# Patient Record
Sex: Female | Born: 1955 | Hispanic: No | Marital: Married | Smoking: Never smoker
Health system: Southern US, Community
[De-identification: ages and names within clinical notes are randomized; demographics above are authoritative.]

## PROBLEM LIST (undated history)

## (undated) ENCOUNTER — Emergency Department (HOSPITAL_COMMUNITY): Admission: EM | Discharge status: Absent without leave | Payer: 59

## (undated) DIAGNOSIS — B1920 Unspecified viral hepatitis C without hepatic coma: Secondary | ICD-10-CM

## (undated) DIAGNOSIS — S065X9A Traumatic subdural hemorrhage with loss of consciousness of unspecified duration, initial encounter: Secondary | ICD-10-CM

## (undated) DIAGNOSIS — K759 Inflammatory liver disease, unspecified: Secondary | ICD-10-CM

## (undated) DIAGNOSIS — I10 Essential (primary) hypertension: Secondary | ICD-10-CM

## (undated) DIAGNOSIS — E079 Disorder of thyroid, unspecified: Secondary | ICD-10-CM

## (undated) DIAGNOSIS — S065XAA Traumatic subdural hemorrhage with loss of consciousness status unknown, initial encounter: Secondary | ICD-10-CM

## (undated) HISTORY — DX: Inflammatory liver disease, unspecified: K75.9

## (undated) HISTORY — PX: CRANIOTOMY: SHX93

---

## 2013-09-19 ENCOUNTER — Emergency Department (HOSPITAL_COMMUNITY): Payer: Self-pay

## 2013-09-19 ENCOUNTER — Encounter (HOSPITAL_COMMUNITY): Payer: Self-pay | Admitting: Emergency Medicine

## 2013-09-19 ENCOUNTER — Emergency Department (HOSPITAL_COMMUNITY)
Admission: EM | Admit: 2013-09-19 | Discharge: 2013-09-19 | Disposition: A | Discharge status: Home / Self Care | Payer: Self-pay | Attending: Emergency Medicine | Admitting: Emergency Medicine

## 2013-09-19 DIAGNOSIS — R5381 Other malaise: Secondary | ICD-10-CM | POA: Insufficient documentation

## 2013-09-19 DIAGNOSIS — Z862 Personal history of diseases of the blood and blood-forming organs and certain disorders involving the immune mechanism: Secondary | ICD-10-CM | POA: Insufficient documentation

## 2013-09-19 DIAGNOSIS — Z8639 Personal history of other endocrine, nutritional and metabolic disease: Secondary | ICD-10-CM | POA: Insufficient documentation

## 2013-09-19 DIAGNOSIS — R5383 Other fatigue: Secondary | ICD-10-CM

## 2013-09-19 DIAGNOSIS — I10 Essential (primary) hypertension: Secondary | ICD-10-CM | POA: Insufficient documentation

## 2013-09-19 DIAGNOSIS — Z8619 Personal history of other infectious and parasitic diseases: Secondary | ICD-10-CM | POA: Insufficient documentation

## 2013-09-19 DIAGNOSIS — R079 Chest pain, unspecified: Secondary | ICD-10-CM | POA: Insufficient documentation

## 2013-09-19 HISTORY — DX: Unspecified viral hepatitis C without hepatic coma: B19.20

## 2013-09-19 HISTORY — DX: Essential (primary) hypertension: I10

## 2013-09-19 HISTORY — DX: Disorder of thyroid, unspecified: E07.9

## 2013-09-19 LAB — HEPATIC FUNCTION PANEL
ALT: 85 U/L — ABNORMAL HIGH (ref 0–35)
AST: 69 U/L — ABNORMAL HIGH (ref 0–37)
Albumin: 3.5 g/dL (ref 3.5–5.2)
Alkaline Phosphatase: 82 U/L (ref 39–117)
Bilirubin, Direct: <0.2 mg/dL (ref 0.0–0.3)
TOTAL PROTEIN: 7 g/dL (ref 6.0–8.3)
Total Bilirubin: 0.3 mg/dL (ref 0.3–1.2)

## 2013-09-19 LAB — BASIC METABOLIC PANEL
BUN: 13 mg/dL (ref 6–23)
CO2: 24 meq/L (ref 19–32)
Calcium: 9.7 mg/dL (ref 8.4–10.5)
Chloride: 104 mEq/L (ref 96–112)
Creatinine, Ser: 0.62 mg/dL (ref 0.50–1.10)
GFR calc Af Amer: >90 mL/min (ref >90)
GFR calc non Af Amer: >90 mL/min (ref >90)
Glucose, Bld: 106 mg/dL — ABNORMAL HIGH (ref 70–99)
POTASSIUM: 4 meq/L (ref 3.7–5.3)
SODIUM: 139 meq/L (ref 137–147)

## 2013-09-19 LAB — CBC
HCT: 43.3 % (ref 36.0–46.0)
Hemoglobin: 14.4 g/dL (ref 12.0–15.0)
MCH: 29.4 pg (ref 26.0–34.0)
MCHC: 33.3 g/dL (ref 30.0–36.0)
MCV: 88.4 fL (ref 78.0–100.0)
PLATELETS: 202 10*3/uL (ref 150–400)
RBC: 4.9 MIL/uL (ref 3.87–5.11)
RDW: 12.7 % (ref 11.5–15.5)
WBC: 8.2 10*3/uL (ref 4.0–10.5)

## 2013-09-19 LAB — I-STAT TROPONIN, ED
TROPONIN I, POC: 0 ng/mL (ref 0.00–0.08)
TROPONIN I, POC: 0 ng/mL (ref 0.00–0.08)

## 2013-09-19 LAB — AMMONIA: AMMONIA: 38 umol/L (ref 11–60)

## 2013-09-19 LAB — LIPASE, BLOOD: LIPASE: 47 U/L (ref 11–59)

## 2013-09-19 NOTE — ED Notes (Signed)
Pt with hx of hep c. Pt's visitor reports that the pt feels more full after eating, and has increased pain on the right side after eating. Pt reports increased gas and bloating.

## 2013-09-19 NOTE — ED Notes (Signed)
Per family sts pt yesterday was having some left arm pain and today began having chest pain, N,V. sts cold chills and hot flashes.

## 2013-09-19 NOTE — Discharge Instructions (Signed)
Fatigue °Fatigue is a feeling of tiredness, lack of energy, lack of motivation, or feeling tired all the time. Having enough rest, good nutrition, and reducing stress will normally reduce fatigue. Consult your caregiver if it persists. The nature of your fatigue will help your caregiver to find out its cause. The treatment is based on the cause.  °CAUSES  °There are many causes for fatigue. Most of the time, fatigue can be traced to one or more of your habits or routines. Most causes fit into one or more of three general areas. They are: °Lifestyle problems °· Sleep disturbances. °· Overwork. °· Physical exertion. °· Unhealthy habits. °· Poor eating habits or eating disorders. °· Alcohol and/or drug use . °· Lack of proper nutrition (malnutrition). °Psychological problems °· Stress and/or anxiety problems. °· Depression. °· Grief. °· Boredom. °Medical Problems or Conditions °· Anemia. °· Pregnancy. °· Thyroid gland problems. °· Recovery from major surgery. °· Continuous pain. °· Emphysema or asthma that is not well controlled °· Allergic conditions. °· Diabetes. °· Infections (such as mononucleosis). °· Obesity. °· Sleep disorders, such as sleep apnea. °· Heart failure or other heart-related problems. °· Cancer. °· Kidney disease. °· Liver disease. °· Effects of certain medicines such as antihistamines, cough and cold remedies, prescription pain medicines, heart and blood pressure medicines, drugs used for treatment of cancer, and some antidepressants. °SYMPTOMS  °The symptoms of fatigue include:  °· Lack of energy. °· Lack of drive (motivation). °· Drowsiness. °· Feeling of indifference to the surroundings. °DIAGNOSIS  °The details of how you feel help guide your caregiver in finding out what is causing the fatigue. You will be asked about your present and past health condition. It is important to review all medicines that you take, including prescription and non-prescription items. A thorough exam will be done.  You will be questioned about your feelings, habits, and normal lifestyle. Your caregiver may suggest blood tests, urine tests, or other tests to look for common medical causes of fatigue.  °TREATMENT  °Fatigue is treated by correcting the underlying cause. For example, if you have continuous pain or depression, treating these causes will improve how you feel. Similarly, adjusting the dose of certain medicines will help in reducing fatigue.  °HOME CARE INSTRUCTIONS  °· Try to get the required amount of good sleep every night. °· Eat a healthy and nutritious diet, and drink enough water throughout the day. °· Practice ways of relaxing (including yoga or meditation). °· Exercise regularly. °· Make plans to change situations that cause stress. Act on those plans so that stresses decrease over time. Keep your work and personal routine reasonable. °· Avoid street drugs and minimize use of alcohol. °· Start taking a daily multivitamin after consulting your caregiver. °SEEK MEDICAL CARE IF:  °· You have persistent tiredness, which cannot be accounted for. °· You have fever. °· You have unintentional weight loss. °· You have headaches. °· You have disturbed sleep throughout the night. °· You are feeling sad. °· You have constipation. °· You have dry skin. °· You have gained weight. °· You are taking any new or different medicines that you suspect are causing fatigue. °· You are unable to sleep at night. °· You develop any unusual swelling of your legs or other parts of your body. °SEEK IMMEDIATE MEDICAL CARE IF:  °· You are feeling confused. °· Your vision is blurred. °· You feel faint or pass out. °· You develop severe headache. °· You develop severe abdominal, pelvic, or   back pain. °· You develop chest pain, shortness of breath, or an irregular or fast heartbeat. °· You are unable to pass a normal amount of urine. °· You develop abnormal bleeding such as bleeding from the rectum or you vomit blood. °· You have thoughts  about harming yourself or committing suicide. °· You are worried that you might harm someone else. °MAKE SURE YOU:  °· Understand these instructions. °· Will watch your condition. °· Will get help right away if you are not doing well or get worse. °Document Released: 01/28/2007 Document Revised: 06/25/2011 Document Reviewed: 01/28/2007 °ExitCare® Patient Information ©2014 [employer]. ° ° ° °Emergency Department Resource Guide °1) Find a Doctor and Pay Out of Pocket °Although you won't have to find out who is covered by your insurance plan, it is a good idea to ask around and get recommendations. You will then need to call the office and see if the doctor you have chosen will accept you as a new patient and what types of options they offer for patients who are self-pay. Some doctors offer discounts or will set up payment plans for their patients who do not have insurance, but you will need to ask so you aren't surprised when you get to your appointment. ° °2) Contact Your Local Health Department °Not all health departments have doctors that can see patients for sick visits, but many do, so it is worth a call to see if yours does. If you don't know where your local health department is, you can check in your phone book. The CDC also has a tool to help you locate your state's health department, and many state websites also have listings of all of their local health departments. ° °3) Find a Walk-in Clinic °If your illness is not likely to be very severe or complicated, you may want to try a walk in clinic. These are popping up all over the country in pharmacies, drugstores, and shopping centers. They're usually staffed by nurse practitioners or physician assistants that have been trained to treat common illnesses and complaints. They're usually fairly quick and inexpensive. However, if you have serious medical issues or chronic medical problems, these are probably not your best option. ° °No Primary Care  Doctor: °- Call [facility] at  832-8000 - they can help you locate a primary care doctor that  accepts your insurance, provides certain services, etc. °- Physician Referral Service- 1-800-533-3463 ° °Chronic Pain Problems: °Organization         Address  Phone   Notes  °[facility]  (336) 297-2271 Patients need to be referred by their primary care doctor.  ° °Medication Assistance: °Organization         Address  Phone   Notes  °[place] [facility] [address] °[place], [place] [address] (336) 641-8030 --Must be a resident of [place] °-- Must have NO insurance coverage whatsoever (no Medicaid/ Medicare, etc.) °-- The pt. MUST have a primary care doctor that directs their care regularly and follows them in the community °  °[facility]  (866) 331-1348   °[employer]  (888) 892-1162   ° °Agencies that provide inexpensive medical care: °Organization         Address  Phone   Notes  °[facility]  (336) 832-8035   °[facility]    (336) 832-7272   °[facility] [address] °[place], [place] [address] (336) 832-4777   °  [facility] [address], °[place] (336) 271-4999   °[facility]    (336) 373-0678   °[facility]    (336) 272-1050   °[facility] ° [address], [place] Phone:  (336) 832-4444, Fax:  (336) 832-4440 Hours of Operation:  9 am - 6 pm, M-F.  Also accepts Medicaid/Medicare and self-pay.  °[facility] ° [address], [place] Phone: (336) 832-3150, Fax: (336) 832-3151. Hours of Operation:  8:30 am - 5:30 pm, M-F.  Also accepts Medicaid and self-pay.  °[facility] [place] [address], [place] Phone: (336) 878-6027   °[facility] [address], [place], [place] (336)723-1848, Ext. 123 Mondays & Thursdays: 7-9 AM.  First 15 patients are seen on a first  come, first serve basis. °  ° °Medicaid-accepting [place] Providers: ° °Organization         Address  Phone   Notes  °[facility] [address], [place] (336) 641-2100 Also accepts self-pay patients.  °[facility] [address], [place] ° (336) 856-9996   °[facility] [address], [place] (336) 288-8857   °[facility] [address] [place] [address], [place] (336) 299-7000   °Veita Bland [address], [place]  ° (336) 373-1557 Only accepts [place] Access Medicaid patients after they have their name applied to their card.  ° °Self-Pay (no insurance) in [place]: ° °Organization         Address  Phone   Notes  °Sickle Cell Patients, [facility] [address], [place] (336) 832-1970   °[facility] [address], [place] (336) 832-4400   °[facility] ° [address], [place] (336) 992-4800   °[facility]/Dr. Osei-Bonsu ° [address] [place] [address], [place] or [address], [place] (336) 841-8500 Phone number for both [place] and [place] locations is the same.  °[facility] [address], [place] (336) 299-0000   °[facility] [address] [place] [address], [place] or [address] (336) 852-7530 °(336) 878-2260   °[facility] [address], [place] (336) 350-1642, phone; (336) 294-5005, fax Sees patients 1st and 3rd Saturday of every month.  Must not qualify for public or private insurance (i.e. Medicaid, Medicare, [place] Health Choice, Veterans' Benefits) • Household income should be no more than 200% of the poverty level •The clinic cannot treat you if you are pregnant or think you are pregnant • Sexually transmitted diseases are not treated at the clinic.  ° ° °Dental Care: °Organization          Address  Phone  Notes  °[facility] [address], [place] (336) 641-6152 Accepts children up to age 21 who are enrolled in Medicaid or [place] Health Choice; pregnant women with a Medicaid card; and children who have applied for Medicaid or [place] Health Choice, but were declined, whose parents can pay a reduced fee at time of service.  °[facility] [place]  [address], [place] (336) 641-7733 Accepts children up to age 21 who are enrolled in Medicaid or [place] Health Choice; pregnant women with a Medicaid card; and   children who have applied for Medicaid or [place] Health Choice, but were declined, whose parents can pay a reduced fee at time of service.  °[facility] ° [address], [place] (336) 641-4533 Patients are seen by appointment only. Walk-ins are not accepted. [facility] will see patients 18 years of age and older. °Monday - Tuesday (8am-5pm) °Most Wednesdays (8:30-5pm) °$30 per visit, cash only  °[facility] ° [address], [place] (336) 641-4533 Patients are seen by appointment only. Walk-ins are not accepted. [facility] will see patients 18 years of age and older. °One Wednesday Evening (Monthly: Volunteer Based).  $30 per visit, cash only  °[facility]  (919) 537-3737 for adults; Children under age 4, call Graduate Pediatric Dentistry at (919) [phone]. Children aged 4-14, please call (919) [phone] to request a pediatric application. ° Dental services are provided in all areas of dental care including fillings, crowns and bridges, complete and partial dentures, implants, gum treatment, root canals, and extractions. Preventive care is also provided. Treatment is provided to both adults and children. °Patients are selected via a lottery and there is often a waiting list. °  °[facility] [address], °[place] ° (336) 763-8833 [email or web address] °  °[facility] [address], [place], [place] (336)723-1848, Ext. 123 Second and Fourth Thursday of each month, opens at 6:30 AM; Clinic ends at 9 AM.  Patients are seen on a first-come first-served basis, and a limited number are seen during each clinic.  ° °[facility] ° [address], [place], [place] (336) 723-7904   Eligibility Requirements °You must have lived in [place], [place], or [place] counties for at least the last three months. °  You cannot be eligible for state or federal sponsored healthcare insurance, including Veterans Administration, Medicaid, or Medicare. °  You generally cannot be eligible for healthcare insurance through your employer.  °  How to apply: °Eligibility screenings are held every Tuesday and Wednesday afternoon from 1:00 pm until 4:00 pm. You do not need an appointment for the interview!  °[facility] [address], [place], [place] 336-631-2330   °[facility]  336-342-8273   °[facility]  336-703-3100   °[facility]  336-570-6415   ° °Behavioral Health Resources in the Community: °Intensive Outpatient Programs °Organization         Address  Phone  Notes  °[facility] [address], [place], [place] 336-878-6098   °[facility] [address], [place], [place] 336-832-9800   °[facility] [address], [place], [place] ° 336-882-2125   °[facility] [address],  °[place], [place] 1-800-853-5163 or 336-641-4981   °Substance Abuse Resources °Organization         Address  Phone  Notes  °[facility]  336-882-2125   °[facility]  336-784-9470   °[facility]  336-285-9073   °[facility]  336-845-3988   °Residential & Outpatient Substance Abuse Program  1-800-659-3381   °Psychological  Services °Organization         Address  Phone  Notes  °[facility]  336- 832-9600   °[facility]  336- 378-7881   °[facility] [address], [place] 1-800-853-5163 or 336-641-4981   ° °Mobile Crisis Teams °Organization           Address  Phone  Notes  °[facility], Mobile Crisis Care Unit  1-877-626-1772   °Assertive °[facility] ° [address] [place], [place] 336-834-9664   °Sharon DeEsch [address] °[place] 336-554-5454   ° °Self-Help/Support Groups °Organization         Address  Phone             Notes  °[facility] - variety of support groups  336- 373-1402 Call for more information  °Narcotics Anonymous (NA), [facility] [address], °[place]  2 meetings at this location  ° °Residential Treatment Programs °Organization         Address  Phone  Notes  °[facility] [address],    °[place]  1-866-801-8205   °[facility] ° [address], [place], [place] 704-293-8524   °[facility] [address], [place] 336-845-3988 Admissions: 8am-3pm M-F  °[facility] [address]    °[place], [place] 336-841-1104   °[facility] [address], [place], [place] 336-379-7146   °[facility] [address]  °[place], [place] 336-285-9073   °[facility] Intensive Outpatient [address], [place], [place] 336-852-3033   °[facility] [address]  °[place], [place] 1-877-615-2722 or 336-784-9470   °[facility] [address] [place], [place] 336-227-7417 Accepts Medicaid  °[facility] [address]  ° [place] 1-800-659-3381 Substance Abuse/Addiction Treatment  ° °[place] Behavioral Health Resources °Organization         Address  Phone  Notes  °[facility]  (888)  581-9988   °Julie Brannon, PhD [address] [place], [place]   (336) 349-5553 or (336) 951-0000   °[facility]   [address] °[place], [place] (336) 349-4454   °[facility] [address], [place], [place] (336) 342-8316 Insurance/Medicaid/sponsorship through [facility]  °[facility] [address]                                    [place], [place] (336) 342-8316 Therapy/tele-psych/case  °[facility] [address]  ° [place], [place] (336) 349-2233    °Dr. Arfeen  (336) 349-4544   °[facility] [place]  [employer] [place] [facility] 1) [address], [place] °2) [address], [place] °3)  [address], [place] (336) 349-3220 °(336) 342-7768 ° °(336) 342-8140   °[place] [facility] (336) 342-1394 or (336) 342-3537 (After Hours)    ° °  °

## 2013-09-19 NOTE — ED Provider Notes (Signed)
CSN: [id number]     Arrival date & time 09/19/13  1425 History   First MD Initiated Contact with Patient 09/19/13 1928     Chief Complaint  Patient presents with  . Chest Pain  . Arm Pain   Family translator HPI Pt has been having trouble with general fatigue for a long period of time.  She was told in the past she had hepatitis C.  [facility] but has not been able to get in touch with them.  Pt has been having pain in her whole body for a long time but yesterday she had pain in her left arm.  Today then she had pain in her left chest and still has pain in her left arm.  She has been nauseated but no vomiting.  No shortness of breath.  No abdominal pain but she does feel feel bloated with eating. Past Medical History  Diagnosis Date  . Hypertension   . Thyroid disease   . Hepatitis C    History reviewed. No pertinent past surgical history. History reviewed. No pertinent family history. History  Substance Use Topics  . Smoking status: Never Smoker   . Smokeless tobacco: Not on file  . Alcohol Use: No   OB History   Grav Para Term Preterm Abortions TAB SAB Ect Mult Living                 Review of Systems  All other systems reviewed and are negative.     Allergies  Review of patient's allergies indicates no known allergies.  Home Medications   Prior to Admission medications   Not on File   BP 146/73  Pulse 71  Temp(Src) 98.7 F (37.1 C) (Oral)  Resp 23  Wt 152 lb 7 oz (69.145 kg)  SpO2 98% Physical Exam  Nursing note and vitals reviewed. Constitutional: She appears well-developed and well-nourished. No distress.  HENT:  Head: Normocephalic and atraumatic.  Right Ear: External ear normal.  Left Ear: External ear normal.  Eyes: Conjunctivae are normal. Right eye exhibits no discharge. Left eye exhibits no discharge. No scleral icterus.  Neck: Neck supple. No tracheal deviation present.  Cardiovascular: Normal rate, regular rhythm and intact  distal pulses.   Pulmonary/Chest: Effort normal and breath sounds normal. No stridor. No respiratory distress. She has no wheezes. She has no rales.  Abdominal: Soft. Bowel sounds are normal. She exhibits no distension. There is no tenderness. There is no rebound and no guarding.  Musculoskeletal: She exhibits no edema and no tenderness.  Neurological: She is alert. She has normal strength. No cranial nerve deficit (no facial droop, extraocular movements intact, no slurred speech) or sensory deficit. She exhibits normal muscle tone. She displays no seizure activity. Coordination normal.  Skin: Skin is warm and dry. No rash noted.  Psychiatric: She has a normal mood and affect.    ED Course  Procedures (including critical care time) Labs Review Labs Reviewed  BASIC METABOLIC PANEL - Abnormal; Notable for the following:    Glucose, Bld 106 (*)    All other components within normal limits  HEPATIC FUNCTION PANEL - Abnormal; Notable for the following:    AST 69 (*)    ALT 85 (*)    All other components within normal limits  CBC  LIPASE, BLOOD  AMMONIA  I-STAT TROPOININ, ED  [place] SensorI-STAT TROPOININ, ED    Imaging Review Dg Chest 2 View  09/19/2013   CLINICAL DATA:  Left-sided chest  pain. Cough. Hypertension. Chronic hepatitis-C.  EXAM: CHEST  2 VIEW  COMPARISON:  None.  FINDINGS: The heart size and mediastinal contours are within normal limits. Both lungs are clear. Tiny calcified granuloma noted in the right upper lobe. The visualized skeletal structures are unremarkable.  IMPRESSION: No active cardiopulmonary disease.   Electronically Signed   By: Myles Rosenthal M.D.   On: 09/19/2013 16:08     EKG Interpretation   Date/Time:  Saturday September 19 2013 14:34:00 EDT Ventricular Rate:  73 PR Interval:  164 QRS Duration: 64 QT Interval:  376 QTC Calculation: 414 R Axis:   -9 Text Interpretation:  Normal sinus rhythm Low voltage QRS No old tracing  to compare Confirmed by Joyous Gleghorn  MD-J, Otila Starn ([id number])  on 09/19/2013 7:45:54 PM      MDM   Final diagnoses:  Fatigue  Chest pain    The patient has had symptoms like this for years. The patient recently moved to this country and does not have any primary medical care. She was monitored for several hours in the emergency department. 2 sets of cardiac enzymes were normal. Patient is not anemic. Electrolytes unremarkable. EKG and chest x-ray are showing.  Discussed the findings with the patient and family. At this point there does not appear to be any evidence of an acute emergency medical condition. I think she would benefit from a primary care physician and a full physical to further investigate her issues with chronic fatigue. Family is also concerned about monitoring of her hepatitis which primary care doctor could also manage.    Linwood Dibbles, MD 09/19/13 2158

## 2013-11-03 ENCOUNTER — Emergency Department (HOSPITAL_COMMUNITY): Payer: Self-pay

## 2013-11-03 ENCOUNTER — Encounter (HOSPITAL_COMMUNITY): Payer: Self-pay | Admitting: Emergency Medicine

## 2013-11-03 ENCOUNTER — Emergency Department (HOSPITAL_COMMUNITY)
Admission: EM | Admit: 2013-11-03 | Discharge: 2013-11-03 | Disposition: A | Discharge status: Home / Self Care | Payer: Self-pay | Attending: Emergency Medicine | Admitting: Emergency Medicine

## 2013-11-03 DIAGNOSIS — J3489 Other specified disorders of nose and nasal sinuses: Secondary | ICD-10-CM | POA: Insufficient documentation

## 2013-11-03 DIAGNOSIS — R259 Unspecified abnormal involuntary movements: Secondary | ICD-10-CM | POA: Insufficient documentation

## 2013-11-03 DIAGNOSIS — R51 Headache: Secondary | ICD-10-CM | POA: Insufficient documentation

## 2013-11-03 DIAGNOSIS — M542 Cervicalgia: Secondary | ICD-10-CM | POA: Insufficient documentation

## 2013-11-03 DIAGNOSIS — H9319 Tinnitus, unspecified ear: Secondary | ICD-10-CM | POA: Insufficient documentation

## 2013-11-03 DIAGNOSIS — R42 Dizziness and giddiness: Secondary | ICD-10-CM | POA: Insufficient documentation

## 2013-11-03 DIAGNOSIS — I10 Essential (primary) hypertension: Secondary | ICD-10-CM | POA: Insufficient documentation

## 2013-11-03 DIAGNOSIS — M2550 Pain in unspecified joint: Secondary | ICD-10-CM | POA: Insufficient documentation

## 2013-11-03 DIAGNOSIS — H9190 Unspecified hearing loss, unspecified ear: Secondary | ICD-10-CM | POA: Insufficient documentation

## 2013-11-03 DIAGNOSIS — R519 Headache, unspecified: Secondary | ICD-10-CM

## 2013-11-03 DIAGNOSIS — R5383 Other fatigue: Secondary | ICD-10-CM

## 2013-11-03 DIAGNOSIS — Z8619 Personal history of other infectious and parasitic diseases: Secondary | ICD-10-CM | POA: Insufficient documentation

## 2013-11-03 DIAGNOSIS — R55 Syncope and collapse: Secondary | ICD-10-CM | POA: Insufficient documentation

## 2013-11-03 DIAGNOSIS — R64 Cachexia: Secondary | ICD-10-CM | POA: Insufficient documentation

## 2013-11-03 DIAGNOSIS — E079 Disorder of thyroid, unspecified: Secondary | ICD-10-CM | POA: Insufficient documentation

## 2013-11-03 DIAGNOSIS — R5381 Other malaise: Secondary | ICD-10-CM | POA: Insufficient documentation

## 2013-11-03 DIAGNOSIS — M549 Dorsalgia, unspecified: Secondary | ICD-10-CM | POA: Insufficient documentation

## 2013-11-03 DIAGNOSIS — H5710 Ocular pain, unspecified eye: Secondary | ICD-10-CM | POA: Insufficient documentation

## 2013-11-03 LAB — COMPREHENSIVE METABOLIC PANEL
ALT: 126 U/L — AB (ref 0–35)
AST: 107 U/L — AB (ref 0–37)
Albumin: 3.3 g/dL — ABNORMAL LOW (ref 3.5–5.2)
Alkaline Phosphatase: 111 U/L (ref 39–117)
Anion gap: 10 (ref 5–15)
BUN: 11 mg/dL (ref 6–23)
CALCIUM: 9.2 mg/dL (ref 8.4–10.5)
CO2: 25 mEq/L (ref 19–32)
Chloride: 107 mEq/L (ref 96–112)
Creatinine, Ser: 0.58 mg/dL (ref 0.50–1.10)
GFR calc Af Amer: >90 mL/min (ref >90)
GFR calc non Af Amer: >90 mL/min (ref >90)
GLUCOSE: 90 mg/dL (ref 70–99)
POTASSIUM: 4.1 meq/L (ref 3.7–5.3)
SODIUM: 142 meq/L (ref 137–147)
TOTAL PROTEIN: 6.9 g/dL (ref 6.0–8.3)
Total Bilirubin: 0.3 mg/dL (ref 0.3–1.2)

## 2013-11-03 LAB — I-STAT TROPONIN, ED: Troponin i, poc: 0.01 ng/mL (ref 0.00–0.08)

## 2013-11-03 LAB — CBC WITH DIFFERENTIAL/PLATELET
Basophils Absolute: 0 10*3/uL (ref 0.0–0.1)
Basophils Relative: 0 % (ref 0–1)
EOS ABS: 0.3 10*3/uL (ref 0.0–0.7)
Eosinophils Relative: 4 % (ref 0–5)
HCT: 43.9 % (ref 36.0–46.0)
Hemoglobin: 14.7 g/dL (ref 12.0–15.0)
LYMPHS ABS: 3.6 10*3/uL (ref 0.7–4.0)
LYMPHS PCT: 49 % — AB (ref 12–46)
MCH: 29.5 pg (ref 26.0–34.0)
MCHC: 33.5 g/dL (ref 30.0–36.0)
MCV: 88.2 fL (ref 78.0–100.0)
Monocytes Absolute: 0.5 10*3/uL (ref 0.1–1.0)
Monocytes Relative: 6 % (ref 3–12)
NEUTROS PCT: 41 % — AB (ref 43–77)
Neutro Abs: 3 10*3/uL (ref 1.7–7.7)
Platelets: 205 10*3/uL (ref 150–400)
RBC: 4.98 MIL/uL (ref 3.87–5.11)
RDW: 12.4 % (ref 11.5–15.5)
WBC: 7.4 10*3/uL (ref 4.0–10.5)

## 2013-11-03 LAB — URINALYSIS, ROUTINE W REFLEX MICROSCOPIC
Bilirubin Urine: NEGATIVE
GLUCOSE, UA: NEGATIVE mg/dL
Hgb urine dipstick: NEGATIVE
Ketones, ur: NEGATIVE mg/dL
NITRITE: NEGATIVE
PH: 6.5 (ref 5.0–8.0)
Protein, ur: NEGATIVE mg/dL
SPECIFIC GRAVITY, URINE: 1.01 (ref 1.005–1.030)
Urobilinogen, UA: 0.2 mg/dL (ref 0.0–1.0)

## 2013-11-03 LAB — URINE MICROSCOPIC-ADD ON

## 2013-11-03 MED ORDER — SODIUM CHLORIDE 0.9 % IV BOLUS (SEPSIS)
1000.0000 mL | Freq: Once | INTRAVENOUS | Status: AC
  Administered 2013-11-03: 1000 mL via INTRAVENOUS

## 2013-11-03 MED ORDER — MECLIZINE HCL 25 MG PO TABS
25.0000 mg | ORAL_TABLET | Freq: Once | ORAL | Status: AC
  Administered 2013-11-03: 25 mg via ORAL
  Filled 2013-11-03: qty 1

## 2013-11-03 MED ORDER — ACETAMINOPHEN 325 MG PO TABS
650.0000 mg | ORAL_TABLET | Freq: Once | ORAL | Status: AC
  Administered 2013-11-03: 650 mg via ORAL
  Filled 2013-11-03: qty 2

## 2013-11-03 MED ORDER — PROCHLORPERAZINE EDISYLATE 5 MG/ML IJ SOLN
10.0000 mg | Freq: Four times a day (QID) | INTRAMUSCULAR | Status: DC | PRN
  Administered 2013-11-03: 10 mg via INTRAVENOUS
  Filled 2013-11-03: qty 2

## 2013-11-03 MED ORDER — DIPHENHYDRAMINE HCL 50 MG/ML IJ SOLN
25.0000 mg | Freq: Once | INTRAMUSCULAR | Status: AC
  Administered 2013-11-03: 25 mg via INTRAVENOUS
  Filled 2013-11-03: qty 1

## 2013-11-03 MED ORDER — DIAZEPAM 5 MG PO TABS
5.0000 mg | ORAL_TABLET | Freq: Once | ORAL | Status: AC
  Administered 2013-11-03: 5 mg via ORAL
  Filled 2013-11-03: qty 1

## 2013-11-03 NOTE — Discharge Instructions (Signed)
Please followup with a primary care provider for continued evaluation and treatment.   Migraine Headache A migraine headache is an intense, throbbing pain on one or both sides of your head. A migraine can last for 30 minutes to several hours. CAUSES  The exact cause of a migraine headache is not always known. However, a migraine may be caused when nerves in the brain become irritated and release chemicals that cause inflammation. This causes pain. Certain things may also trigger migraines, such as:  Alcohol.  Smoking.  Stress.  Menstruation.  Aged cheeses.  Foods or drinks that contain nitrates, glutamate, aspartame, or tyramine.  Lack of sleep.  Chocolate.  Caffeine.  Hunger.  Physical exertion.  Fatigue.  Medicines used to treat chest pain (nitroglycerine), birth control pills, estrogen, and some blood pressure medicines. SIGNS AND SYMPTOMS  Pain on one or both sides of your head.  Pulsating or throbbing pain.  Severe pain that prevents daily activities.  Pain that is aggravated by any physical activity.  Nausea, vomiting, or both.  Dizziness.  Pain with exposure to bright lights, loud noises, or activity.  General sensitivity to bright lights, loud noises, or smells. Before you get a migraine, you may get warning signs that a migraine is coming (aura). An aura may include:  Seeing flashing lights.  Seeing bright spots, halos, or zig-zag lines.  Having tunnel vision or blurred vision.  Having feelings of numbness or tingling.  Having trouble talking.  Having muscle weakness. DIAGNOSIS  A migraine headache is often diagnosed based on:  Symptoms.  Physical exam.  A CT scan or MRI of your head. These imaging tests cannot diagnose migraines, but they can help rule out other causes of headaches. TREATMENT Medicines may be given for pain and nausea. Medicines can also be given to help prevent recurrent migraines.  HOME CARE INSTRUCTIONS  Only  take over-the-counter or prescription medicines for pain or discomfort as directed by your health care provider. The use of long-term narcotics is not recommended.  Lie down in a dark, quiet room when you have a migraine.  Keep a journal to find out what may trigger your migraine headaches. For example, write down:  What you eat and drink.  How much sleep you get.  Any change to your diet or medicines.  Limit alcohol consumption.  Quit smoking if you smoke.  Get 7-9 hours of sleep, or as recommended by your health care provider.  Limit stress.  Keep lights dim if bright lights bother you and make your migraines worse. SEEK IMMEDIATE MEDICAL CARE IF:   Your migraine becomes severe.  You have a fever.  You have a stiff neck.  You have vision loss.  You have muscular weakness or loss of muscle control.  You start losing your balance or have trouble walking.  You feel faint or pass out.  You have severe symptoms that are different from your first symptoms. MAKE SURE YOU:   Understand these instructions.  Will watch your condition.  Will get help right away if you are not doing well or get worse. Document Released: 04/02/2005 Document Revised: 01/21/2013 Document Reviewed: 12/08/2012 [facility] Patient Information 2015 [employer] [place]. This information is not intended to replace advice given to you by your health care provider. Make sure you discuss any questions you have with your health care provider.

## 2013-11-03 NOTE — ED Provider Notes (Signed)
CSN: [id number]     Arrival date & time 11/03/13  1644 History   First MD Initiated Contact with Patient 11/03/13 1735     Chief Complaint  Patient presents with  . Headache     (Consider location/radiation/quality/duration/timing/severity/associated sxs/prior Treatment) HPI  Patient is 58 yo Female with hx of Hep C, hypothyroidism, and HTN here with headache for 3-4 days, dizziness, and syncope today around 3 pm. She has tightness on her neck and tinnitus. Also has some hearing loss. She feels dizzy with some vertigo. She tried tylenol and Excedrin without relief for the HA. She feels pressure on top of her eyes and feels tight on her head. Her eyes feel tired. She recently moved from [place]. At [place], she was having coma like episodes per family member which was thought to be due to Hep C. Denies any cardiac problem in the past. Has some questionable history of seizure.   She has some tremors, even during our conversation. She feels upper abdominal pressure/pain with movement or exertion. Currently she doesn't feel this. Has spasm/tightness of her left leg. Has gum bleeding in the morning when she tries to brush her teeth.  Denies diarrhea/constipation, has nausea, denies emesis. Has lower back pain. Feels time tingling on her arms. Denies any focal weakness. Feels generalized weakness.    Past Medical History  Diagnosis Date  . Hypertension   . Thyroid disease   . Hepatitis C    History reviewed. No pertinent past surgical history. History reviewed. No pertinent family history. History  Substance Use Topics  . Smoking status: Never Smoker   . Smokeless tobacco: Not on file  . Alcohol Use: No   OB History   Grav Para Term Preterm Abortions TAB SAB Ect Mult Living                 Review of Systems  Constitutional: Positive for chills and fatigue. Negative for fever.  HENT: Positive for congestion, hearing loss, tinnitus and trouble swallowing. Negative for ear discharge,  ear pain, facial swelling, nosebleeds, postnasal drip, sinus pressure, sneezing and sore throat.   Eyes: Positive for pain. Negative for photophobia and redness.  Endocrine: Negative.   Genitourinary: Negative.   Musculoskeletal: Positive for arthralgias, back pain, gait problem, neck pain and neck stiffness. Negative for joint swelling and myalgias.  Skin: Negative.   Neurological: Positive for dizziness, tremors, syncope, light-headedness and headaches. Negative for seizures, facial asymmetry, speech difficulty, weakness and numbness.  Hematological: Negative.   Psychiatric/Behavioral: Negative.     Allergies  Review of patient's allergies indicates no known allergies.  Home Medications   Prior to Admission medications   Medication Sig Start Date End Date Taking? Authorizing Provider  acetaminophen (TYLENOL) 325 MG tablet Take 650 mg by mouth every 6 (six) hours as needed for headache.   Yes Historical Provider, MD  aspirin-acetaminophen-caffeine (EXCEDRIN MIGRAINE) [id number] MG per tablet Take 1 tablet by mouth every 6 (six) hours as needed for headache.   Yes Historical Provider, MD  bisoprolol-hydrochlorothiazide (ZIAC) 2.5-6.25 MG per tablet Take 1 tablet by mouth daily.   Yes Historical Provider, MD  ibuprofen (ADVIL,MOTRIN) 200 MG tablet Take 200 mg by mouth every 6 (six) hours as needed for headache.   Yes Historical Provider, MD  levothyroxine (SYNTHROID, LEVOTHROID) 75 MCG tablet Take 75 mcg by mouth daily before breakfast.   Yes Historical Provider, MD   BP 146/84  Pulse 56  Temp(Src) 98.9 F (37.2 C) (Oral)  Resp 23  SpO2 100% Physical Exam  Constitutional: She is oriented to person, place, and time. Vital signs are normal. She appears cachectic. She has a sickly appearance.  HENT:  Head: Normocephalic and atraumatic.  Right Ear: External ear normal.  Left Ear: External ear normal.  Nose: Right sinus exhibits frontal sinus tenderness. Right sinus exhibits no  maxillary sinus tenderness. Left sinus exhibits frontal sinus tenderness. Left sinus exhibits no maxillary sinus tenderness.  Mouth/Throat: Uvula is midline. Mucous membranes are dry.  Eyes: Conjunctivae and EOM are normal. Pupils are equal, round, and reactive to light. Left conjunctiva has no hemorrhage. No scleral icterus.  Neck: Trachea normal, normal range of motion and full passive range of motion without pain. Neck supple. No JVD present. Muscular tenderness present. No spinous process tenderness present. No rigidity. Normal range of motion present. No Brudzinski's sign and no Kernig's sign noted. No mass present.  Cardiovascular: Normal rate and regular rhythm.  Exam reveals no gallop.   No murmur heard. Pulmonary/Chest: Effort normal. No respiratory distress. She exhibits no mass, no tenderness, no laceration and no edema.  Abdominal: Soft. Normal appearance and bowel sounds are normal. There is no tenderness. There is no rebound and no CVA tenderness.  Musculoskeletal: Normal range of motion. She exhibits no edema.  Right hand thenar region mildly tender to palpation from falling during her syncope. ROM normal.   Neurological: She is alert and oriented to person, place, and time. She has normal strength. She displays tremor. No cranial nerve deficit or sensory deficit. She displays a negative Romberg sign. Coordination and gait normal.  Normal nose-to-finger test. Normal repetitive task. Normal heel to shin test. No pronator drift.   Skin: Skin is warm.    ED Course  Procedures (including critical care time) Labs Review Labs Reviewed  CBC WITH DIFFERENTIAL  COMPREHENSIVE METABOLIC PANEL  URINALYSIS, ROUTINE W REFLEX MICROSCOPIC  I-STAT TROPOININ, ED    Imaging Review Ct Head Wo Contrast  11/03/2013   CLINICAL DATA:  Headache  EXAM: CT HEAD WITHOUT CONTRAST  TECHNIQUE: Contiguous axial images were obtained from the base of the skull through the vertex without intravenous contrast.   COMPARISON:  None.  FINDINGS: No skull fracture is noted. Paranasal sinuses mastoid air cells are unremarkable.  No intracranial hemorrhage, mass effect or midline shift.  No hydrocephalus. No acute cortical infarction. No mass lesion is noted on this unenhanced scan.  The gray and white-matter differentiation is preserved.  IMPRESSION: No acute intracranial abnormality.   Electronically Signed   By: Natasha Mead M.D.   On: 11/03/2013 20:30   Dg Hand Complete Right  11/03/2013   CLINICAL DATA:  Larey Seat today with pain in the fifth metacarpal radiating into distal forearm  EXAM: RIGHT HAND - COMPLETE 3+ VIEW  COMPARISON:  None.  FINDINGS: There is no evidence of fracture or dislocation. There is no evidence of arthropathy or other focal bone abnormality. Soft tissues are unremarkable.  IMPRESSION: Negative.   Electronically Signed   By: Esperanza Heir M.D.   On: 11/03/2013 20:33     EKG Interpretation   Date/Time:  Tuesday November 03 2013 18:47:46 EDT Ventricular Rate:  55 PR Interval:  170 QRS Duration: 76 QT Interval:  439 QTC Calculation: 420 R Axis:   -9 Text Interpretation:  Sinus rhythm Anterior infarct, old Confirmed by  HORTON  MD, COURTNEY ([id number]) on 11/03/2013 7:15:21 PM      UA, CBC, CMP, EKG, troponin, CT head. Valium 5mg  PO + meclizine 25  mg PO.   Patient continues to feel dizzy. Able to walk in the room by herself with normal gait.  Hand xray normal. CT head normal.  HA is not improving. Dizziness resolved with meclizine.   Bolus 1 L, compazine and benadryl.  MDM   Final diagnoses:  Acute intractable headache, unspecified headache type  Syncope, unspecified syncope type   Patient here with persistent headache, syncope, dizziness, tremors, and tinnitus. Normal neuro exam. CT head was normal. Is borderline orthostatic (pulse increases by 15 upon standing).  Should be able to go home if headache gets better with fludis and compazine+benadryl.   Should follow up with a  primary care doctor about her symptoms.     Hyacinth Meeker, MD 11/03/13 2114

## 2013-11-03 NOTE — ED Notes (Signed)
Pt alert x4 respirations easy non labored.  

## 2013-11-03 NOTE — ED Notes (Signed)
Patient transported to CT 

## 2013-11-03 NOTE — ED Notes (Signed)
Pt placed into gown and on monitor upon arrival to room. Pt monitored by 5 lead, blood pressure, and pulse ox.  

## 2013-11-03 NOTE — ED Notes (Signed)
Pt placed back on monitor upon return to room from CT. Pt monitored by 12 lead, blood pressure, and pulse ox.

## 2013-11-03 NOTE — ED Notes (Signed)
Pt and family member reports pt having severe headache for several days. No relief with tylenol or excedrin. Reports dizziness and feeling off balance for several days and having nausea, no vomiting.

## 2013-11-03 NOTE — ED Notes (Signed)
Pt ambulates with one assist to bathroom

## 2013-11-03 NOTE — ED Notes (Signed)
Pt continues to be monitored by blood pressure, 5 lead, blood pressure.

## 2013-11-03 NOTE — ED Provider Notes (Signed)
Alyssa Lyons S 8:30 PM patient discussed in sign out. Patient having a headache with history of the same. There was a delay in obtaining blood for testing. Head CT unremarkable. Normal nonfocal neuro exam. Patient receiving migraine cocktail. Will evaluate laboratory testing and recheck condition.  10:00 PM laboratory testing unremarkable. Negative head CT. Patient reports no headache after medication she is feeling well continues to have normal nonfocal neuro exam. May be discharged at this time.  Angus Sellereter S Debera [name], PA-C 11/03/13 2210

## 2013-11-04 NOTE — ED Provider Notes (Signed)
Medical screening examination/treatment/procedure(s) were performed by non-physician practitioner and as supervising physician I was immediately available for consultation/collaboration.   EKG Interpretation   Date/Time:  Tuesday November 03 2013 18:47:46 EDT Ventricular Rate:  55 PR Interval:  170 QRS Duration: 76 QT Interval:  439 QTC Calculation: 420 R Axis:   -9 Text Interpretation:  Sinus rhythm Anterior infarct, old Confirmed by  HORTON  MD, COURTNEY ([id number]) on 11/03/2013 7:15:21 PM       Derwood KaplanAnkit Delaine Hernandez, MD 11/04/13 [id number]

## 2013-11-05 NOTE — ED Provider Notes (Signed)
I saw and evaluated the patient, reviewed the resident's note and I agree with the findings and plan.   EKG Interpretation   Date/Time:  Tuesday November 03 2013 18:47:46 EDT Ventricular Rate:  55 PR Interval:  170 QRS Duration: 76 QT Interval:  439 QTC Calculation: 420 R Axis:   -9 Text Interpretation:  Sinus rhythm Anterior infarct, old Confirmed by  Stephon Weathers  MD, Lamyiah Crawshaw ([id number]) on 11/03/2013 7:15:21 PM      This is a 58 year old female with a history of hypertension who presents with headache, vertigo, and one episode of syncope. Patient reports headache for 2 days. It is achy in nature and gradual in onset. Currently pain is 6/10. She endorses ringing in her ear as well as hearing loss. She describes her dizziness as room spinning. She had an episode of room spinning dizziness and reports that she passed out. She did not hit her head. No prior episodes like this. She denies any chest pain or shortness of breath during these episodes.  Patient is nontoxic on exam.  Vital signs are stable.  No neurologic deficits noted on exam with no noted difficulties with finger-nose-finger. Patient also able to ambulate without difficulty. Heart rate does increase 15 when patient stands up. Patient was given fluids, meclizine, and Valium. She reports improvement of her vertigo symptoms. She's also given a headache cocktail.  EKG shows no evidence of arrhythmia and CT head is negative. Lab work is pending at time of signout.  Have low suspicion at this time for stroke given headache and lack of focal neurologic deficit. Patient's symptoms most consistent with peripheral vertigo. Syncope may related. Cardiac evaluation thus far has been unrevealing.  If lab work is within normal limits and patient has improvement of symptoms with treatment, she can be discharged home with primary care followup.  Shon Batonourtney F Seila Liston, MD 11/05/13 1447

## 2016-05-01 ENCOUNTER — Ambulatory Visit (INDEPENDENT_AMBULATORY_CARE_PROVIDER_SITE_OTHER): Payer: BLUE CROSS/BLUE SHIELD | Admitting: Student

## 2016-05-01 VITALS — BP 110/72 | HR 71 | Temp 97.8°F | Resp 18 | Wt 148.0 lb

## 2016-05-01 DIAGNOSIS — Z7189 Other specified counseling: Secondary | ICD-10-CM

## 2016-05-01 DIAGNOSIS — E039 Hypothyroidism, unspecified: Secondary | ICD-10-CM | POA: Diagnosis not present

## 2016-05-01 DIAGNOSIS — Z7184 Encounter for health counseling related to travel: Secondary | ICD-10-CM | POA: Insufficient documentation

## 2016-05-01 DIAGNOSIS — Z23 Encounter for immunization: Secondary | ICD-10-CM

## 2016-05-01 MED ORDER — LEVOTHYROXINE SODIUM 75 MCG PO TABS: 75.0000 ug | ORAL_TABLET | Freq: Every day | ORAL | 1 refills | Status: DC

## 2016-05-01 MED ORDER — DOXYCYCLINE HYCLATE 100 MG PO TABS: 100.0000 mg | ORAL_TABLET | Freq: Every day | ORAL | 0 refills | Status: DC

## 2016-05-01 NOTE — Assessment & Plan Note (Signed)
Recheck TSH and refill synthroid.  Follow up yearly if normal.

## 2016-05-01 NOTE — Assessment & Plan Note (Signed)
Recommended flu and meningitis vaccinations.  She can have malaria prophylaxis for her travels, which is 3 months.  Gave precautions for food/water and bug spray.

## 2016-05-01 NOTE — Progress Notes (Addendum)
Subjective:     Patient ID: Alyssa CorningShafqat Eckerman, female   DOB: 06/15/1955, 61 y.o.   MRN: [id number]  HPI Presents for travel advice encounter for religious Hajj trip to [place].  She is from [place] and her daughter translates.  She has no other complaints today and would like all vaccinations needed for travel and malaria prophylaxis.    She also has a history of hypothyroidism and needs a refill on synthroid.  She does not know when her last TSH check was.  Denies heat or cold intolerance, diarrhea, constipation.     Review of Systems  Constitutional: Negative for chills, fatigue and fever.  HENT: Negative for congestion and rhinorrhea.   Respiratory: Negative for cough, chest tightness and shortness of breath.   Cardiovascular: Negative for chest pain and leg swelling.  Gastrointestinal: Negative for abdominal pain and nausea.  Genitourinary: Negative for dysuria and urgency.  Musculoskeletal: Negative for arthralgias and joint swelling.  Skin: Negative for rash and wound.  Psychiatric/Behavioral: Negative for agitation and confusion.  All other systems reviewed and are negative.      Objective:   Physical Exam  Constitutional: She is oriented to person, place, and time. She appears well-developed and well-nourished. No distress.  HENT:  Head: Normocephalic and atraumatic.  Right Ear: External ear normal.  Left Ear: External ear normal.  Neck: Normal range of motion. Neck supple.  Pulmonary/Chest: Effort normal. No respiratory distress.  Musculoskeletal: Normal range of motion. She exhibits no edema.  Neurological: She is alert and oriented to person, place, and time.  Skin: Skin is warm. No rash noted. She is not diaphoretic. No erythema.  Psychiatric: She has a normal mood and affect. Her behavior is normal. Judgment and thought content normal.  Nursing note and vitals reviewed.  BP 110/72 (BP Location: Right Arm, Patient Position: Sitting, Cuff Size: Small)   Pulse 71    Temp 97.8 F (36.6 C) (Oral)   Resp 18   Wt 148 lb (67.1 kg)   SpO2 99%      Assessment:     Travel advice encounter Recommended flu and meningitis vaccinations.  She can have malaria prophylaxis for her travels, which is 3 months.  Gave precautions for food/water and bug spray.  Hypothyroidism Recheck TSH and refill synthroid.  Follow up yearly if normal.         Plan:          Patient was counseled reviewing diagnosis and treatment in detail, totaling in 30 minutes, over half of which was spent in face to face counseling.  Signed,  Corliss MarcusAlicia Barnes, DO [facility] 8:31 PM 05/01/16

## 2016-05-01 NOTE — Patient Instructions (Signed)
     IF you received an x-ray today, you will receive an invoice from [facility]. Please contact [facility] at [phone] with questions or concerns regarding your invoice.   IF you received labwork today, you will receive an invoice from [facility]. Please contact [facility] at [phone] with questions or concerns regarding your invoice.   Our billing staff will not be able to assist you with questions regarding bills from these companies.  You will be contacted with the lab results as soon as they are available. The fastest way to get your results is to activate your My Chart account. Instructions are located on the last page of this paperwork. If you have not heard from us regarding the results in 2 weeks, please contact this office.     

## 2016-05-02 LAB — TSH: TSH: 3.26 u[IU]/mL (ref 0.450–4.500)

## 2016-05-11 ENCOUNTER — Ambulatory Visit (INDEPENDENT_AMBULATORY_CARE_PROVIDER_SITE_OTHER): Payer: BLUE CROSS/BLUE SHIELD

## 2016-05-11 ENCOUNTER — Ambulatory Visit (INDEPENDENT_AMBULATORY_CARE_PROVIDER_SITE_OTHER): Payer: BLUE CROSS/BLUE SHIELD | Admitting: Family Medicine

## 2016-05-11 VITALS — BP 160/96 | HR 66 | Temp 98.4°F | Resp 16 | Ht 58.5 in | Wt 150.0 lb

## 2016-05-11 DIAGNOSIS — Z1231 Encounter for screening mammogram for malignant neoplasm of breast: Secondary | ICD-10-CM

## 2016-05-11 DIAGNOSIS — Z0000 Encounter for general adult medical examination without abnormal findings: Secondary | ICD-10-CM

## 2016-05-11 DIAGNOSIS — R0602 Shortness of breath: Secondary | ICD-10-CM | POA: Diagnosis not present

## 2016-05-11 DIAGNOSIS — Z131 Encounter for screening for diabetes mellitus: Secondary | ICD-10-CM

## 2016-05-11 DIAGNOSIS — Z13220 Encounter for screening for lipoid disorders: Secondary | ICD-10-CM | POA: Diagnosis not present

## 2016-05-11 DIAGNOSIS — Z1239 Encounter for other screening for malignant neoplasm of breast: Secondary | ICD-10-CM

## 2016-05-11 DIAGNOSIS — I10 Essential (primary) hypertension: Secondary | ICD-10-CM | POA: Diagnosis not present

## 2016-05-11 DIAGNOSIS — Z01419 Encounter for gynecological examination (general) (routine) without abnormal findings: Secondary | ICD-10-CM

## 2016-05-11 DIAGNOSIS — Z1211 Encounter for screening for malignant neoplasm of colon: Secondary | ICD-10-CM

## 2016-05-11 DIAGNOSIS — R9431 Abnormal electrocardiogram [ECG] [EKG]: Secondary | ICD-10-CM

## 2016-05-11 LAB — POCT WET + KOH PREP
Trich by wet prep: ABSENT
YEAST BY KOH: ABSENT
Yeast by wet prep: ABSENT

## 2016-05-11 MED ORDER — ZOSTER VACCINE LIVE 19400 UNT/0.65ML ~~LOC~~ SUSR: 0.6500 mL | Freq: Once | SUBCUTANEOUS | 0 refills | Status: AC

## 2016-05-11 MED ORDER — BISOPROLOL-HYDROCHLOROTHIAZIDE 2.5-6.25 MG PO TABS: 1.0000 | ORAL_TABLET | Freq: Every day | ORAL | 2 refills | Status: DC

## 2016-05-11 MED ORDER — NYSTATIN 100000 UNIT/GM EX OINT: 1.0000 "application " | TOPICAL_OINTMENT | Freq: Two times a day (BID) | CUTANEOUS | 1 refills | Status: DC

## 2016-05-11 NOTE — Patient Instructions (Addendum)
You will be contacted with the results of your chest x-ray.  You will be notified of your lab results  I have placed a referral for you to see gastroenterology for your colonoscopy, mammogram, cardiology, and pulmonology.   Nystatin ointment for vaginal discharge.  Losartan-HCTZ 50-12.5 mg once daily for blood pressure.  IF you received an x-ray today, you [facility]. Please contact [facility] at [phone] with questions or concerns regarding your invoice.   IF you received labwork today, you will receive an invoice from [facility]. Please contact [facility] at [phone] with questions or concerns regarding your invoice.   Our billing staff will not be able to assist you with questions regarding bills from these companies.  You will be contacted with the lab results as soon as they are available. The fastest way to get your results is to activate your My Chart account. Instructions are located on the last page of this paperwork. If you have not heard from us regarding the results in 2 weeks, please contact this office.      Hypertension Hypertension is another name for high blood pressure. High blood pressure forces your heart to work harder to pump blood. A blood pressure reading has two numbers, which includes a higher number over a lower number (example: 110/72). Follow these instructions at home:  Have your blood pressure rechecked by your doctor.  Only take medicine as told by your doctor. Follow the directions carefully. The medicine does not work as well if you skip doses. Skipping doses also puts you at risk for problems.  Do not smoke.  Monitor your blood pressure at home as told by your doctor. Contact a doctor if:  You think you are having a reaction to the medicine you are taking.  You have repeat headaches or feel dizzy.  You have puffiness (swelling) in your ankles.  You have trouble with your vision. Get help  right away if:  You get a very bad headache and are confused.  You feel weak, numb, or faint.  You get chest or belly (abdominal) pain.  You throw up (vomit).  You cannot breathe very well. This information is not intended to replace advice given to you by your health care provider. Make sure you discuss any questions you have with your health care provider. Document Released: 09/19/2007 Document Revised: 09/08/2015 Document Reviewed: 01/23/2013 [employer]vier Interactive Patient Education  2017 [employer]

## 2016-05-11 NOTE — Progress Notes (Signed)
Alyssa Lyons Lyons  MRN: [id number] DOB: 02/29/1956  Subjective:  Alyssa BarryShafqat is a 61 y.o. female who presents for annual physical exam and complaints of shortness of breath, arthralgias neck and shoulders, swelling of bilateral feet. Moved here from [place] 2 years ago. Reports no prior hospitalized for any illness.  Hypertension  Reports that she is not taking any blood pressure medication   Dx hypertension while living in [place]. Lots of mucus in throat and chronically coughs.  Hepatitis B  No current treatment or infectious disease specialist monitoring condition. No recent liver enzyme screenings Originally diagnosed in [place]  Hypothyroidism   Pt was last seen in office 05/01/16 for travel counseling and during that visit a TSH was screened with normal results. Pt continues to take Synthroid.  Non-specific Physical Complaints Swelling on right foot with some generalized weakness. This has been a chronic problems for over several months. Reports shortness of breath with general activities. Walking for extended periods of time increases weakness to the point of feeling a sensation as if she is about to fall. Occasional headaches which she take ibuprofen and obtains relief. Reports a chronic muscular tightness around the neck and shoulder. Denies injury and  Last pap smear: Unknown Last mammogram:Never  Last colonoscopy:Never Vaccinations           Zostavax-Requests   Patient Active Problem List   Diagnosis Date Noted  . Travel advice encounter 05/01/2016  . Hypothyroidism 05/01/2016    Current Outpatient Prescriptions on File Prior to Visit  Medication Sig Dispense Refill  . bisoprolol-hydrochlorothiazide (ZIAC) 2.5-6.25 MG per tablet Take 1 tablet by mouth daily.    Marland Kitchen. levothyroxine (SYNTHROID, LEVOTHROID) 75 MCG tablet Take 1 tablet (75 mcg total) by mouth daily before breakfast. 90 tablet 1  . doxycycline (VIBRA-TABS) 100 MG tablet Take 1 tablet (100 mg total) by mouth  daily. (Patient not taking: Reported on 05/11/2016) 120 tablet 0   No current facility-administered medications on file prior to visit.     No Known Allergies  Social History   Social History  . Marital status: Married    Spouse name: N/A  . Number of children: N/A  . Years of education: N/A   Social History Main Topics  . Smoking status: Never Smoker  . Smokeless tobacco: Never Used  . Alcohol use No  . Drug use: No  . Sexual activity: Not Asked   Other Topics Concern  . None   Social History Narrative  . None   Review of Systems   See HPI  Objective:  BP (!) 168/100 (BP Location: Right Arm, Patient Position: Sitting, Cuff Size: Normal)   Pulse 66   Temp 98.4 F (36.9 C) (Oral)   Resp 16   Ht 4' 10.5" (1.486 m)   Wt 150 lb (68 kg)   SpO2 99%   BMI 30.82 kg/m   Physical Exam  Constitutional: She is oriented to person, place, and time and well-developed, well-nourished, and in no distress.  HENT:  Head: Normocephalic and atraumatic.  Right Ear: External ear normal.  Left Ear: External ear normal.  Nose: Nose normal.  Mouth/Throat: Oropharynx is clear and moist.  Eyes: Conjunctivae and EOM are normal. Pupils are equal, round, and reactive to light.  Neck: Normal range of motion. Neck supple.  Cardiovascular: Normal rate, regular rhythm, normal heart sounds and intact distal pulses.   Pulmonary/Chest: Effort normal and breath sounds normal.  Abdominal: Soft. Bowel sounds are normal.  Musculoskeletal: Normal range of motion.  Neurological: She is alert and oriented to person, place, and time. Gait normal. GCS score is 15.  Skin: Skin is warm and dry.  Psychiatric: Mood, memory, affect and judgment normal.    Assessment and Plan :  Discussed healthy lifestyle, diet, exercise, preventative care, vaccinations, and addressed patient's concerns. Plan for follow up on 05/14/16 for a blood pressure recheck. Otherwise, plan for specific conditions below.  1. Annual  physical exam -Age-appropriate anticipatory guidance provided.  2. Gynecologic exam normal - Pap IG and HPV (high risk) DNA detection - POCT Wet + KOH Prep  3. Shortness of breath - CBC with Differential/Platelet - EKG 12-Lead  4. Screening, lipid - Lipid panel  5. Screening for diabetes mellitus - Comprehensive metabolic panel - Hemoglobin A1c  6. Special screening for malignant neoplasms, colon - Ambulatory referral to Gastroenterology  7. Screening for breast cancer - MM Digital Screening  8. Hypertension, unstable -Bisoprolol-hydrochlorothiazide 2.5-6.25 mg tablet once daily -Return for follow-up 05/14/2016 for blood pressure recheck  Godfrey Pick. Tiburcio Pea, MSN, FNP-C Primary Care at [facility] [phone]

## 2016-05-13 LAB — CBC WITH DIFFERENTIAL/PLATELET
BASOS ABS: 0 10*3/uL (ref 0.0–0.2)
Basos: 0 %
EOS (ABSOLUTE): 0.3 10*3/uL (ref 0.0–0.4)
Eos: 3 %
HEMOGLOBIN: 15.1 g/dL (ref 11.1–15.9)
Hematocrit: 46.7 % — ABNORMAL HIGH (ref 34.0–46.6)
Immature Grans (Abs): 0 10*3/uL (ref 0.0–0.1)
Immature Granulocytes: 0 %
LYMPHS ABS: 4.8 10*3/uL — AB (ref 0.7–3.1)
Lymphs: 51 %
MCH: 29.4 pg (ref 26.6–33.0)
MCHC: 32.3 g/dL (ref 31.5–35.7)
MCV: 91 fL (ref 79–97)
MONOCYTES: 7 %
Monocytes Absolute: 0.6 10*3/uL (ref 0.1–0.9)
NEUTROS ABS: 3.6 10*3/uL (ref 1.4–7.0)
Neutrophils: 39 %
Platelets: 223 10*3/uL (ref 150–379)
RBC: 5.14 x10E6/uL (ref 3.77–5.28)
RDW: 12.8 % (ref 12.3–15.4)
WBC: 9.4 10*3/uL (ref 3.4–10.8)

## 2016-05-13 LAB — COMPREHENSIVE METABOLIC PANEL
A/G RATIO: 1.4 (ref 1.2–2.2)
ALBUMIN: 4.2 g/dL (ref 3.6–4.8)
ALK PHOS: 101 IU/L (ref 39–117)
ALT: 58 IU/L — ABNORMAL HIGH (ref 0–32)
AST: 48 IU/L — ABNORMAL HIGH (ref 0–40)
BUN / CREAT RATIO: 19 (ref 12–28)
BUN: 13 mg/dL (ref 8–27)
CHLORIDE: 104 mmol/L (ref 96–106)
CO2: 24 mmol/L (ref 18–29)
Calcium: 10.1 mg/dL (ref 8.7–10.3)
Creatinine, Ser: 0.68 mg/dL (ref 0.57–1.00)
GFR calc Af Amer: 110 mL/min/{1.73_m2} (ref >59)
GFR calc non Af Amer: 95 mL/min/{1.73_m2} (ref >59)
GLOBULIN, TOTAL: 3 g/dL (ref 1.5–4.5)
Glucose: 97 mg/dL (ref 65–99)
Potassium: 4.5 mmol/L (ref 3.5–5.2)
SODIUM: 146 mmol/L — AB (ref 134–144)
TOTAL PROTEIN: 7.2 g/dL (ref 6.0–8.5)

## 2016-05-13 LAB — LIPID PANEL
Chol/HDL Ratio: 3.2 ratio units (ref 0.0–4.4)
Cholesterol, Total: 102 mg/dL (ref 100–199)
HDL: 32 mg/dL — ABNORMAL LOW (ref >39)
LDL Calculated: 53 mg/dL (ref 0–99)
Triglycerides: 84 mg/dL (ref 0–149)
VLDL Cholesterol Cal: 17 mg/dL (ref 5–40)

## 2016-05-13 LAB — HEMOGLOBIN A1C
ESTIMATED AVERAGE GLUCOSE: 111 mg/dL
Hgb A1c MFr Bld: 5.5 % (ref 4.8–5.6)

## 2016-05-14 ENCOUNTER — Telehealth: Payer: Self-pay | Admitting: Family Medicine

## 2016-05-14 NOTE — Telephone Encounter (Signed)
Spoke with Particia Latherbida pt's daughter to advise of normal lab results. Confirmed pt has an appointment with heart care on Thursday, 2/1. Per daughter, mother is not taking blood pressure medication and she would like to bring her in tomorrow for blood pressure recheck. Gave the telephone number for scheduling and advised to call first thing in the morning.  Godfrey PickKimberly S. Tiburcio PeaHarris, MSN, FNP-C Primary Care at [facility] [phone]

## 2016-05-15 ENCOUNTER — Emergency Department (HOSPITAL_COMMUNITY): Payer: BLUE CROSS/BLUE SHIELD

## 2016-05-15 ENCOUNTER — Emergency Department (HOSPITAL_COMMUNITY)
Admission: EM | Admit: 2016-05-15 | Discharge: 2016-05-15 | Disposition: A | Discharge status: Home / Self Care | Payer: BLUE CROSS/BLUE SHIELD | Attending: Emergency Medicine | Admitting: Emergency Medicine

## 2016-05-15 ENCOUNTER — Encounter (HOSPITAL_COMMUNITY): Payer: Self-pay

## 2016-05-15 DIAGNOSIS — R930 Abnormal findings on diagnostic imaging of skull and head, not elsewhere classified: Secondary | ICD-10-CM | POA: Insufficient documentation

## 2016-05-15 DIAGNOSIS — R0689 Other abnormalities of breathing: Secondary | ICD-10-CM

## 2016-05-15 DIAGNOSIS — I10 Essential (primary) hypertension: Secondary | ICD-10-CM | POA: Diagnosis not present

## 2016-05-15 DIAGNOSIS — R0600 Dyspnea, unspecified: Secondary | ICD-10-CM | POA: Insufficient documentation

## 2016-05-15 DIAGNOSIS — R55 Syncope and collapse: Secondary | ICD-10-CM | POA: Insufficient documentation

## 2016-05-15 DIAGNOSIS — R531 Weakness: Secondary | ICD-10-CM | POA: Insufficient documentation

## 2016-05-15 DIAGNOSIS — E039 Hypothyroidism, unspecified: Secondary | ICD-10-CM | POA: Diagnosis not present

## 2016-05-15 DIAGNOSIS — R079 Chest pain, unspecified: Secondary | ICD-10-CM | POA: Insufficient documentation

## 2016-05-15 LAB — URINALYSIS, ROUTINE W REFLEX MICROSCOPIC
BILIRUBIN URINE: NEGATIVE
Bacteria, UA: NONE SEEN
Glucose, UA: NEGATIVE mg/dL
Hgb urine dipstick: NEGATIVE
Ketones, ur: NEGATIVE mg/dL
NITRITE: NEGATIVE
PH: 6 (ref 5.0–8.0)
Protein, ur: NEGATIVE mg/dL
Specific Gravity, Urine: 1.006 (ref 1.005–1.030)

## 2016-05-15 LAB — BASIC METABOLIC PANEL
ANION GAP: 9 (ref 5–15)
BUN: 11 mg/dL (ref 6–20)
CO2: 25 mmol/L (ref 22–32)
Calcium: 9.7 mg/dL (ref 8.9–10.3)
Chloride: 104 mmol/L (ref 101–111)
Creatinine, Ser: 0.69 mg/dL (ref 0.44–1.00)
Glucose, Bld: 138 mg/dL — ABNORMAL HIGH (ref 65–99)
POTASSIUM: 4 mmol/L (ref 3.5–5.1)
SODIUM: 138 mmol/L (ref 135–145)

## 2016-05-15 LAB — CBC
HCT: 44.4 % (ref 36.0–46.0)
Hemoglobin: 15 g/dL (ref 12.0–15.0)
MCH: 29.9 pg (ref 26.0–34.0)
MCHC: 33.8 g/dL (ref 30.0–36.0)
MCV: 88.4 fL (ref 78.0–100.0)
Platelets: 194 10*3/uL (ref 150–400)
RBC: 5.02 MIL/uL (ref 3.87–5.11)
RDW: 12.3 % (ref 11.5–15.5)
WBC: 8.2 10*3/uL (ref 4.0–10.5)

## 2016-05-15 LAB — TROPONIN I: Troponin I: <0.03 ng/mL (ref <0.03)

## 2016-05-15 LAB — CBG MONITORING, ED: Glucose-Capillary: 101 mg/dL — ABNORMAL HIGH (ref 65–99)

## 2016-05-15 MED ORDER — SODIUM CHLORIDE 0.9 % IV BOLUS (SEPSIS)
1000.0000 mL | Freq: Once | INTRAVENOUS | Status: AC
  Administered 2016-05-15: 1000 mL via INTRAVENOUS

## 2016-05-15 NOTE — ED Notes (Signed)
Pt did not need anything at this time  

## 2016-05-15 NOTE — ED Notes (Signed)
Patient transported to CT 

## 2016-05-15 NOTE — ED Notes (Signed)
Patient d/c'd from IV, monitor, continuous pulse oximetry and blood pressure cuff; patient getting dressed to be discharged home; visitors at bedside 

## 2016-05-15 NOTE — ED Notes (Signed)
Through a family member translator, the patient did not feel well all day yesterday. Pt tried drinking water and resting however nothing helped. Pt again woke up this am after resting and fell complaining pain to the left chest wall.

## 2016-05-15 NOTE — ED Triage Notes (Signed)
Pt states walking to kitchen, fell onto R side. Pt complaining of R lower rib pain. Pt denies LOC, denies head injury/trauma. Pt a/o x 4 at triage.

## 2016-05-15 NOTE — ED Provider Notes (Signed)
[facility]-EMERGENCY DEPT Provider Note   CSN: [id number] Arrival date & time: 05/15/16  0035     History   Chief Complaint Chief Complaint  Patient presents with  . Near Syncope    HPI Alyssa Lyons is a 61 y.o. female.  61 year old female history of hepatitis and hypertension who presents to the emergency department today for dizziness and a fall. Patient states through an interpreter that she felt weak all day yesterday did not feel well at all so she had been eating or drinking secondary to this. No pain, fever, cough, vomiting, diarrhea or new headache. She states that she was walking and she felt lightheaded and she fell landing on her right side. She is having some right-sided chest pain and it progressively worsening headache on that side since that time. She did not syncopized or feel she passes out she just felt lightheaded and off-balance. No other associated modifying symptoms.    Weakness  Primary symptoms comment: overall weakness. This is a new problem. The current episode started yesterday. The problem has been resolved. There was no focality noted. There has been no fever. Associated symptoms include chest pain (after falling, on right side of chest). Pertinent negatives include no shortness of breath, no vomiting and no altered mental status. There were no medications administered prior to arrival.    Past Medical History:  Diagnosis Date  . Hepatitis C   . Hypertension   . Thyroid disease     Patient Active Problem List   Diagnosis Date Noted  . Travel advice encounter 05/01/2016  . Hypothyroidism 05/01/2016    History reviewed. No pertinent surgical history.  OB History    No data available       Home Medications    Prior to Admission medications   Medication Sig Start Date End Date Taking? Authorizing Provider  levothyroxine (SYNTHROID, LEVOTHROID) 75 MCG tablet Take 1 tablet (75 mcg total) by mouth daily before breakfast. 05/01/16  Yes Alicia B  Chitanand, DO  bisoprolol-hydrochlorothiazide (ZIAC) 2.5-6.25 MG tablet Take 1 tablet by mouth daily. 05/11/16   Doyle Askew, FNP  nystatin ointment (MYCOSTATIN) Apply 1 application topically 2 (two) times daily. 05/11/16   Doyle Askew, FNP    Family History History reviewed. No pertinent family history.  Social History Social History  Substance Use Topics  . Smoking status: Never Smoker  . Smokeless tobacco: Never Used  . Alcohol use No     Allergies   Patient has no known allergies.   Review of Systems Review of Systems  Respiratory: Negative for shortness of breath.   Cardiovascular: Positive for chest pain (after falling, on right side of chest).  Gastrointestinal: Negative for vomiting.  Neurological: Positive for weakness.  All other systems reviewed and are negative.    Physical Exam Updated Vital Signs BP 156/87 (BP Location: Left Arm)   Pulse 66   Temp 98 F (36.7 C) (Oral)   Resp 14   Ht 4\' 11"  (1.499 m)   Wt 149 lb (67.6 kg)   SpO2 100%   BMI 30.09 kg/m   Physical Exam  Constitutional: She is oriented to person, place, and time. She appears well-developed and well-nourished.  HENT:  Head: Normocephalic and atraumatic.  Eyes: Conjunctivae and EOM are normal.  Neck: Normal range of motion.  Cardiovascular: Normal rate and regular rhythm.  Exam reveals no gallop and no friction rub.   No murmur heard. Pulmonary/Chest: No stridor. No respiratory distress. She exhibits tenderness (right sided  lateral ribs).  Abdominal: Soft. She exhibits no distension and no mass. There is no tenderness. There is no rebound and no guarding.  Musculoskeletal: Normal range of motion. She exhibits tenderness. She exhibits no edema or deformity.  No cervical spine tenderness, thoracic spine tenderness or Lumbar spine tenderness.  No tenderness or pain with palpation and full ROM of all joints in upper and lower extremities.  No ecchymosis or other  signs of trauma on back or extremities.  No Pain with AP or lateral compression of ribs.  No Paracervical ttp, paraspinal ttp   Neurological: She is alert and oriented to person, place, and time. She displays normal reflexes. No cranial nerve deficit or sensory deficit. She exhibits normal muscle tone.  Skin: Skin is warm and dry. No pallor.  Nursing note and vitals reviewed.    ED Treatments / Results  Labs (all labs ordered are listed, but only abnormal results are displayed) Labs Reviewed  BASIC METABOLIC PANEL - Abnormal; Notable for the following:       Result Value   Glucose, Bld 138 (*)    All other components within normal limits  URINALYSIS, ROUTINE W REFLEX MICROSCOPIC - Abnormal; Notable for the following:    Color, Urine STRAW (*)    Leukocytes, UA MODERATE (*)    Squamous Epithelial / LPF 0-5 (*)    All other components within normal limits  CBG MONITORING, ED - Abnormal; Notable for the following:    Glucose-Capillary 101 (*)    All other components within normal limits  URINE CULTURE  CBC  TROPONIN I    EKG  EKG Interpretation  Date/Time:  Tuesday May 15 2016 00:49:40 EST Ventricular Rate:  64 PR Interval:  164 QRS Duration: 66 QT Interval:  402 QTC Calculation: 414 R Axis:   -22 Text Interpretation:  Normal sinus rhythm Low voltage QRS Inferior infarct , age undetermined Cannot rule out Anteroseptal infarct , age undetermined Abnormal ECG nsc lt Confirmed by [facility] MD, Barbara CowerJASON [phone]([id number]) on 05/15/2016 7:32:45 AM       Radiology Dg Chest 2 View  Result Date: 05/15/2016 CLINICAL DATA:  Through a family member translator, the patient did not feel well all day yesterday. Pt tried drinking water and resting however nothing helped. Pt again woke up this am after resting and fell complaining pain to the left chest wall. Hx of Hep C, HTN, thyroid disease. Non-Smoker EXAM: CHEST  2 VIEW COMPARISON:  05/15/2016 at 6:11 a.m. FINDINGS: Cardiac silhouette is  top-normal in size. No mediastinal or hilar masses. No evidence of adenopathy. There are prominent bronchovascular markings bilaterally similar to the exam dated 05/11/2016. No lung consolidation. No convincing pulmonary edema. No pleural effusion or pneumothorax. Skeletal structures are demineralized but grossly intact. IMPRESSION: No acute cardiopulmonary disease. Electronically Signed   By: Amie Portlandavid  Ormond M.D.   On: 05/15/2016 08:26   Dg Ribs Unilateral W/chest Right  Result Date: 05/15/2016 CLINICAL DATA:  Fall with right rib pain. Mid chest pain and shortness of breath. EXAM: RIGHT RIBS AND CHEST - 3+ VIEW COMPARISON:  Chest radiographs 05/11/2016 FINDINGS: No fracture or other bone lesions are seen involving the ribs. There is no evidence of pneumothorax or pleural effusion. Both lungs are clear. Heart size and mediastinal contours are within normal limits. Low lung volumes leads to mild bronchovascular crowding. IMPRESSION: No evidence of right rib fracture or acute abnormality. Electronically Signed   By: Rubye OaksMelanie  Ehinger M.D.   On: 05/15/2016 06:41   Ct  Head Wo Contrast  Result Date: 05/15/2016 CLINICAL DATA:  Walking to kitchen, fell onto R side. Pt complaining of R lower rib pain. Pt denies LOC, denies head injury/trauma. EXAM: CT HEAD WITHOUT CONTRAST TECHNIQUE: Contiguous axial images were obtained from the base of the skull through the vertex without intravenous contrast. COMPARISON:  11/03/2013 FINDINGS: Brain: No evidence of acute infarction, hemorrhage, hydrocephalus, extra-axial collection or mass lesion/mass effect. Mild periventricular white matter low attenuation as can be seen with microvascular disease. Mild generalized cerebral atrophy. The Vascular: No hyperdense vessel. Intracranial atherosclerotic disease. Skull: No osseous abnormality. Sinuses/Orbits: Visualized paranasal sinuses are clear. Visualized mastoid sinuses are clear. Visualized orbits demonstrate no focal abnormality.  Other: None IMPRESSION: No acute intracranial pathology. Electronically Signed   By: Elige Ko   On: 05/15/2016 09:15    Procedures Procedures (including critical care time)  Medications Ordered in ED Medications  sodium chloride 0.9 % bolus 1,000 mL (1,000 mLs Intravenous New Bag/Given 05/15/16 0919)     Initial Impression / Assessment and Plan / ED Course  I have reviewed the triage vital signs and the nursing notes.  Pertinent labs & imaging results that were available during my care of the patient were reviewed by me and considered in my medical decision making (see chart for details).     Fell yesterday likely 2/2 not eating or drinking. However has q waves on ecg, will eval for cardiac/electrolyte causes. Will also ensure no head/chest injuries from fall. Fluids in mean time. Then will ambulate.   General weakness NOS. Ambulates well. On monitor for a long time without abnormalities. Doubt primary cardiac or neurologic causes. Plan for discharge. Has cards follow up in a couple days. Will see pcp as well to ensure improvement.  Final Clinical Impressions(s) / ED Diagnoses   Final diagnoses:  Weakness      Marily Memos, MD 05/15/16 1005

## 2016-05-16 NOTE — Progress Notes (Signed)
Cardiology Office Note    Date:  05/17/2016   ID:  Alyssa Lyons, DOB 1955/10/04, MRN [id number]  PCP:  No PCP Per Patient  Cardiologist: new Dr. Johnsie Cancel  Chief Complaint  Patient presents with  . New Patient (Initial Visit)    abnormal EKG/chest pain    History of Present Illness:  Alyssa Lyons is a 61 y.o. female history of hepatitis and hypertension who was in the emergency room 05/15/16 with dizziness and near syncope. She felt weak all day and had not been eating or drinking secondary to this. She was walking, felt lightheaded and she fell landing on her right side. She had some rib pain and tenderness after the fall. Had no arrhythmias on the monitor but because she had Q waves on EKG she is referred here. CBC, be met normal, troponin negative, no evidence of rib fracture CT of head negative. EKG normal sinus rhythm with inferior Q waves and poor R-wave progression anteriorly. These are unchanged from 2015.  Patient is here accompanied by her daughter in law and an interpreter. Not feeling well all day. Thought she was hungry after eating she became dizzy and fell. She also had right sided chest pain into her back and a pulling sensation in her chest.  She also had frequent chest tightness and shortness of breath. Has to stop often when walking to catch her breath.Moved here from [place] 3 yrs ago and had the symptoms then. Has HTN, no HLD, DM, never smoked and no family history of CAD.Patient mostly complains of dizziness when she stands up. She said she also feels her heart racing.    Past Medical History:  Diagnosis Date  . Hepatitis C   . Hypertension   . Thyroid disease     History reviewed. No pertinent surgical history.  Current Medications: Outpatient Medications Prior to Visit  Medication Sig Dispense Refill  . bisoprolol-hydrochlorothiazide (ZIAC) 2.5-6.25 MG tablet Take 1 tablet by mouth daily. 90 tablet 2  . levothyroxine (SYNTHROID, LEVOTHROID) 75 MCG  tablet Take 1 tablet (75 mcg total) by mouth daily before breakfast. 90 tablet 1  . nystatin ointment (MYCOSTATIN) Apply 1 application topically 2 (two) times daily. 30 g 1   No facility-administered medications prior to visit.      Allergies:   Patient has no known allergies.   Social History   Social History  . Marital status: Married    Spouse name: N/A  . Number of children: N/A  . Years of education: N/A   Social History Main Topics  . Smoking status: Never Smoker  . Smokeless tobacco: Never Used  . Alcohol use No  . Drug use: No  . Sexual activity: Not Asked   Other Topics Concern  . None   Social History Narrative  . None     Family History:  The patient's   family history includes Heart disease in her father and mother; Hyperlipidemia in her brother, brother, and brother; Hypertension in her brother, brother, brother, father, and mother.   ROS:   Please see the history of present illness.    Review of Systems  Constitution: Positive for malaise/fatigue.  HENT: Negative.   Eyes: Negative.   Cardiovascular: Positive for chest pain.  Respiratory: Negative.   Hematologic/Lymphatic: Negative.   Musculoskeletal: Positive for back pain and myalgias. Negative for joint pain.  Gastrointestinal: Negative.   Genitourinary: Negative.   Neurological: Positive for dizziness and headaches.   All other systems reviewed and are negative.  PHYSICAL EXAM:   VS:  BP (!) 150/90   Pulse 62   Ht 4' 11"  (1.499 m)   Wt 150 lb 3.2 oz (68.1 kg)   BMI 30.34 kg/m   Physical Exam  GEN: Well nourished, well developed, in no acute distress  Neck: no JVD, carotid bruits, or masses Cardiac:RRR; S4 no murmurs, rubs Respiratory:  clear to auscultation bilaterally, normal work of breathing GI: soft, nontender, nondistended, + BS Ext: without cyanosis, clubbing, or edema, Good distal pulses bilaterally MS: no deformity or atrophy  Skin: warm and dry, no rash Neuro:  Alert and  Oriented x 3, Strength and sensation are intact Psych: euthymic mood, full affect  Wt Readings from Last 3 Encounters:  05/17/16 150 lb 3.2 oz (68.1 kg)  05/15/16 149 lb (67.6 kg)  05/11/16 150 lb (68 kg)      Studies/Labs Reviewed:   EKG:  EKG is not ordered today. EKG reviewed from 05/15/16 normal sinus rhythm with inferior Q waves and poor R-wave progression anteriorly. EKGs unchanged since 2015  Recent Labs: 05/01/2016: TSH 3.260 05/11/2016: ALT 58 05/15/2016: BUN 11; Creatinine, Ser 0.69; Hemoglobin 15.0; Platelets 194; Potassium 4.0; Sodium 138   Lipid Panel    Component Value Date/Time   CHOL 102 05/11/2016 1901   TRIG 84 05/11/2016 1901   HDL 32 (L) 05/11/2016 1901   CHOLHDL 3.2 05/11/2016 1901   LDLCALC 53 05/11/2016 1901    Additional studies/ records that were reviewed today include:      ASSESSMENT:    1. Abnormal EKG   2. Chest pain, unspecified type   3. SOB (shortness of breath)   4. Dizziness      PLAN:  In order of problems listed above:  Abnormal EKG inferior Q waves and poor R-wave progression anteriorly on EKG dating back to 2015.  Chest pain and shortness of breath mostly exertional with abnormal EKG. We'll order exercise Myoview to rule out ischemia. Patient says she like to walk on the treadmill but I suspect she will need Lexi scan. Also check 2-D echo for LV function.  Dizziness patient is not orthostatic in the office but her blood pressure is quite high and goes up with standing. Increase Ziac to 5/6.25 mg daily. Will hold for stress test. Follow-up with me in one month.   Medication Adjustments/Labs and Tests Ordered: Current medicines are reviewed at length with the patient today.  Concerns regarding medicines are outlined above.  Medication changes, Labs and Tests ordered today are listed in the Patient Instructions below. Patient Instructions  Medication Instructions:  Your physician recommends that you continue on your current  medications as directed. Please refer to the Current Medication list given to you today.   Labwork: None ordered  Testing/Procedures: Your physician has requested that you have an echocardiogram. Echocardiography is a painless test that uses sound waves to create images of your heart. It provides your doctor with information about the size and shape of your heart and how well your heart's chambers and valves are working. This procedure takes approximately one hour. There are no restrictions for this procedure.    Follow-Up: Your physician recommends that you schedule a follow-up appointment in:    Any Other Special Instructions Will Be Listed Below (If Applicable).  Echocardiogram An echocardiogram, or echocardiography, uses sound waves (ultrasound) to produce an image of your heart. The echocardiogram is simple, painless, obtained within a short period of time, and offers valuable information to your health care provider. The  images from an echocardiogram can provide information such as:  Evidence of coronary artery disease (CAD).  Heart size.  Heart muscle function.  Heart valve function.  Aneurysm detection.  Evidence of a past heart attack.  Fluid buildup around the heart.  Heart muscle thickening.  Assess heart valve function. Tell a health care provider about:  Any allergies you have.  All medicines you are taking, including vitamins, herbs, eye drops, creams, and over-the-counter medicines.  Any problems you or family members have had with anesthetic medicines.  Any blood disorders you have.  Any surgeries you have had.  Any medical conditions you have.  Whether you are pregnant or may be pregnant. What happens before the procedure? No special preparation is needed. Eat and drink normally. What happens during the procedure?  In order to produce an image of your heart, gel will be applied to your chest and a wand-like tool (transducer) will be moved over  your chest. The gel will help transmit the sound waves from the transducer. The sound waves will harmlessly bounce off your heart to allow the heart images to be captured in real-time motion. These images will then be recorded.  You may need an IV to receive a medicine that improves the quality of the pictures. What happens after the procedure? You may return to your normal schedule including diet, activities, and medicines, unless your health care provider tells you otherwise. This information is not intended to replace advice given to you by your health care provider. Make sure you discuss any questions you have with your health care provider. Document Released: 03/30/2000 Document Revised: 11/19/2015 Document Reviewed: 12/08/2012 Elsevier Interactive Patient Education  2017 [employer].    If you need a refill on your cardiac medications before your next appointment, please call your pharmacy.      Sumner Boast, PA-C  05/17/2016 9:24 AM    [facility] [address], [place], [place]  [address] Phone: [phone]; Fax: [phone]

## 2016-05-17 ENCOUNTER — Encounter: Payer: Self-pay | Admitting: Physician Assistant

## 2016-05-17 ENCOUNTER — Ambulatory Visit (INDEPENDENT_AMBULATORY_CARE_PROVIDER_SITE_OTHER): Payer: BLUE CROSS/BLUE SHIELD | Admitting: Physician Assistant

## 2016-05-17 ENCOUNTER — Encounter (INDEPENDENT_AMBULATORY_CARE_PROVIDER_SITE_OTHER): Payer: Self-pay

## 2016-05-17 VITALS — BP 150/90 | HR 62 | Ht 59.0 in | Wt 150.2 lb

## 2016-05-17 DIAGNOSIS — R42 Dizziness and giddiness: Secondary | ICD-10-CM | POA: Insufficient documentation

## 2016-05-17 DIAGNOSIS — R079 Chest pain, unspecified: Secondary | ICD-10-CM

## 2016-05-17 DIAGNOSIS — R0602 Shortness of breath: Secondary | ICD-10-CM | POA: Diagnosis not present

## 2016-05-17 DIAGNOSIS — R9431 Abnormal electrocardiogram [ECG] [EKG]: Secondary | ICD-10-CM | POA: Diagnosis not present

## 2016-05-17 LAB — URINE CULTURE: Culture: 20000 — AB

## 2016-05-17 MED ORDER — BISOPROLOL-HYDROCHLOROTHIAZIDE 5-6.25 MG PO TABS: 1.0000 | ORAL_TABLET | Freq: Every day | ORAL | 3 refills | Status: DC

## 2016-05-17 NOTE — Patient Instructions (Addendum)
Medication Instructions:  Your physician has recommended you make the following change in your medication:  1.  INCREASE the Bisoprolol-Hydrochlorothiazide to 5/6.25 taking 1 tablet daily   Labwork: None ordered  Testing/Procedures: Your physician has requested that you have an echocardiogram. Echocardiography is a painless test that uses sound waves to create images of your heart. It provides your doctor with information about the size and shape of your heart and how well your heart's chambers and valves are working. This procedure takes approximately one hour. There are no restrictions for this procedure.  Your physician has requested that you have an exercise tolerance test. For further information please visit [email or web address] Please also follow instruction sheet, as given.    Follow-Up: Your physician recommends that you schedule a follow-up appointment in: WITH MICHELE LENZE, PA-C   Any Other Special Instructions Will Be Listed Below (If Applicable).  Echocardiogram An echocardiogram, or echocardiography, uses sound waves (ultrasound) to produce an image of your heart. The echocardiogram is simple, painless, obtained within a short period of time, and offers valuable information to your health care provider. The images from an echocardiogram can provide information such as:  Evidence of coronary artery disease (CAD).  Heart size.  Heart muscle function.  Heart valve function.  Aneurysm detection.  Evidence of a past heart attack.  Fluid buildup around the heart.  Heart muscle thickening.  Assess heart valve function. Tell a health care provider about:  Any allergies you have.  All medicines you are taking, including vitamins, herbs, eye drops, creams, and over-the-counter medicines.  Any problems you or family members have had with anesthetic medicines.  Any blood disorders you have.  Any surgeries you have had.  Any medical conditions you  have.  Whether you are pregnant or may be pregnant. What happens before the procedure? No special preparation is needed. Eat and drink normally. What happens during the procedure?  In order to produce an image of your heart, gel will be applied to your chest and a wand-like tool (transducer) will be moved over your chest. The gel will help transmit the sound waves from the transducer. The sound waves will harmlessly bounce off your heart to allow the heart images to be captured in real-time motion. These images will then be recorded.  You may need an IV to receive a medicine that improves the quality of the pictures. What happens after the procedure? You may return to your normal schedule including diet, activities, and medicines, unless your health care provider tells you otherwise. This information is not intended to replace advice given to you by your health care provider. Make sure you discuss any questions you have with your health care provider. Document Released: 03/30/2000 Document Revised: 11/19/2015 Document Reviewed: 12/08/2012 Elsevier Interactive Patient Education  2017 Elsevier Inc.     Exercise Stress Electrocardiogram An exercise stress electrocardiogram is a test that is done to evaluate the blood supply to your heart. This test may also be called exercise stress electrocardiography. The test is done while you are walking on a treadmill. The goal of this test is to raise your heart rate. This test is done to find areas of poor blood flow to the heart by determining the extent of coronary artery disease (CAD). CAD is defined as narrowing in one or more heart (coronary) arteries of more than 70%. If you have an abnormal test result, this may mean that you are not getting adequate blood flow to your heart during exercise. Additional testing  may be needed to understand why your test was abnormal. Tell a health care provider about:  Any allergies you have.  All medicines you  are taking, including vitamins, herbs, eye drops, creams, and over-the-counter medicines.  Any problems you or family members have had with anesthetic medicines.  Any blood disorders you have.  Any surgeries you have had.  Any medical conditions you have.  Possibility of pregnancy, if this applies. What are the risks? Generally, this is a safe procedure. However, as with any procedure, complications can occur. Possible complications can include:  Pain or pressure in the following areas:  Chest.  Jaw or neck.  Between your shoulder blades.  Radiating down your left arm.  Dizziness or light-headedness.  Shortness of breath.  Increased or irregular heartbeats.  Nausea or vomiting.  Heart attack (rare). What happens before the procedure?  Avoid all forms of caffeine 24 hours before your test or as directed by your health care provider. This includes coffee, tea (even decaffeinated tea), caffeinated sodas, chocolate, cocoa, and certain pain medicines.  Follow your health care provider's instructions regarding eating and drinking before the test.  Take your medicines as directed at regular times with water unless instructed otherwise. Exceptions may include:  If you have diabetes, ask how you are to take your insulin or pills. It is common to adjust insulin dosing the morning of the test.  If you are taking beta-blocker medicines, it is important to talk to your health care provider about these medicines well before the date of your test. Taking beta-blocker medicines may interfere with the test. In some cases, these medicines need to be changed or stopped 24 hours or more before the test.  If you wear a nitroglycerin patch, it may need to be removed prior to the test. Ask your health care provider if the patch should be removed before the test.  If you use an inhaler for any breathing condition, bring it with you to the test.  If you are an outpatient, bring a snack so you  can eat right after the stress phase of the test.  Do not smoke for 4 hours prior to the test or as directed by your health care provider.  Do not apply lotions, powders, creams, or oils on your chest prior to the test.  Wear loose-fitting clothes and comfortable shoes for the test. This test involves walking on a treadmill. What happens during the procedure?  Multiple patches (electrodes) will be put on your chest. If needed, small areas of your chest may have to be shaved to get better contact with the electrodes. Once the electrodes are attached to your body, multiple wires will be attached to the electrodes and your heart rate will be monitored.  Your heart will be monitored both at rest and while exercising.  You will walk on a treadmill. The treadmill will be started at a slow pace. The treadmill speed and incline will gradually be increased to raise your heart rate. What happens after the procedure?  Your heart rate and blood pressure will be monitored after the test.  You may return to your normal schedule including diet, activities, and medicines, unless your health care provider tells you otherwise. This information is not intended to replace advice given to you by your health care provider. Make sure you discuss any questions you have with your health care provider. Document Released: 03/30/2000 Document Revised: 09/08/2015 Document Reviewed: 12/08/2012 Elsevier Interactive Patient Education  2017 [employer]

## 2016-05-18 ENCOUNTER — Encounter: Payer: Self-pay | Admitting: Internal Medicine

## 2016-05-18 ENCOUNTER — Telehealth: Payer: Self-pay

## 2016-05-18 ENCOUNTER — Other Ambulatory Visit: Payer: BLUE CROSS/BLUE SHIELD

## 2016-05-18 ENCOUNTER — Ambulatory Visit (INDEPENDENT_AMBULATORY_CARE_PROVIDER_SITE_OTHER): Payer: BLUE CROSS/BLUE SHIELD | Admitting: Internal Medicine

## 2016-05-18 VITALS — BP 122/80 | HR 70 | Ht 59.0 in | Wt 150.4 lb

## 2016-05-18 DIAGNOSIS — R0689 Other abnormalities of breathing: Principal | ICD-10-CM

## 2016-05-18 DIAGNOSIS — R0600 Dyspnea, unspecified: Secondary | ICD-10-CM

## 2016-05-18 DIAGNOSIS — R059 Cough, unspecified: Secondary | ICD-10-CM

## 2016-05-18 DIAGNOSIS — Z7729 Contact with and (suspected ) exposure to other hazardous substances: Secondary | ICD-10-CM | POA: Insufficient documentation

## 2016-05-18 DIAGNOSIS — R042 Hemoptysis: Secondary | ICD-10-CM | POA: Insufficient documentation

## 2016-05-18 DIAGNOSIS — R05 Cough: Secondary | ICD-10-CM | POA: Insufficient documentation

## 2016-05-18 LAB — D-DIMER, QUANTITATIVE: D-Dimer, Quant: 0.24 mcg/mL FEU (ref <0.50)

## 2016-05-18 MED ORDER — FLUTICASONE FUROATE-VILANTEROL 100-25 MCG/INH IN AEPB: 1.0000 | INHALATION_SPRAY | Freq: Every day | RESPIRATORY_TRACT | 0 refills | Status: DC

## 2016-05-18 NOTE — Progress Notes (Signed)
Subjective:    Patient ID: Alyssa Lyons, female    DOB: 03-14-1956, 61 y.o.   MRN: [id number]  PCP No PCP Per Patient   HPI  IOV 05/18/2016  Chief Complaint  Patient presents with  . Pulm Consult    SOB for the past few months. Productive cough with green mucus.     61 year old [name] female originally from [place]. She immigrated to [place] 7 years ago and has been in the [place] region. Accompanied by her daughter-in-law and an interpreter although the daughter-in-law's excellent history. reports of shortness of breath and cough. [place] and [place]. Her cooking was using [employer] gas but she actually used dung cakes and would stove all her life. She reports insidious onset of shortness of breath for the last 7 years or so and cough for the last 3 years associated with fatigue. According to the daughter-in-law when she moved to [place] 3 years ago dyspnea is mild but since then it has been progressive. Today and walks test 185 feet 3 laps on room air she did not desaturate but she did get dyspneic. Walking small implants is dyspneic especially in the house and walking flights of stairs makes her dyspneic. This is a definite change and worse from before. This no associated chest pain. The cough appears mostly at night and is mild to moderate in intensity and occasionally with streaky hemoptysis. Otherwise the cough is mild yellow sputum. There is associated myalgias and fatigue as well apparently she's been to cardiology yesterday although I could not find it in the chart.   Of note she was in the emergency department on 05/15/2016 with dizziness and near syncope. EKG there was normal sinus rhythm with inferior Q waves and poor R-wave progression which is baseline from 2015. This portion was summarized from old chart review   Chest x-ray 05/15/2016 shows clear lung fields personally visualized this. And I agree with the final report.  There is no change from a chest x-ray from 4 days earlier   Blood work 05/15/2016 shows a creatinine of 0.69 mg percent. Normal troponin of 0.03 hemoglobin 15 g percent.  The family do want to get her tested before 06/01/2016 when she is going to [place] for 3 months.  Exhaled nitric oxide in the office today 05/18/2016: After multiple attempts with the help of the daughter-in-law who is the interpreter cannot get it done.   has a past medical history of Hepatitis C; Hypertension; and Thyroid disease.   reports that she has never smoked. She has never used smokeless tobacco.  No past surgical history on file.  No Known Allergies  Immunization History  Administered Date(s) Administered  . Influenza,inj,Quad PF,36+ Mos 05/01/2016  . Meningococcal Mcv4o 05/01/2016    Family History  Problem Relation Age of Onset  . Hypertension Mother   . Heart disease Mother   . Hypertension Father   . Heart disease Father   . Hyperlipidemia Brother   . Hypertension Brother   . Hypertension Brother   . Hyperlipidemia Brother   . Hyperlipidemia Brother   . Hypertension Brother      Current Outpatient Prescriptions:  .  bisoprolol-hydrochlorothiazide (ZIAC) 5-6.25 MG tablet, Take 1 tablet by mouth daily., Disp: 90 tablet, Rfl: 3 .  levothyroxine (SYNTHROID, LEVOTHROID) 75 MCG tablet, Take 1 tablet (75 mcg total) by mouth daily before breakfast., Disp: 90 tablet, Rfl: 1 .  nystatin ointment (MYCOSTATIN), Apply 1  application topically 2 (two) times daily., Disp: 30 g, Rfl: 1   Review of Systems  Constitutional: Negative for fever and unexpected weight change.  HENT: Negative for congestion, dental problem, ear pain, nosebleeds, postnasal drip, rhinorrhea, sinus pressure, sneezing, sore throat and trouble swallowing.   Eyes: Negative for redness and itching.  Respiratory: Positive for cough, chest tightness and shortness of breath. Negative for wheezing.   Cardiovascular: Positive for leg  swelling. Negative for palpitations.  Gastrointestinal: Negative for nausea and vomiting.  Genitourinary: Negative for dysuria.  Musculoskeletal: Positive for joint swelling.  Skin: Negative for rash.  Neurological: Negative for headaches.  Hematological: Does not bruise/bleed easily.  Psychiatric/Behavioral: Negative for dysphoric mood. The patient is not nervous/anxious.        Objective:   Physical Exam  Constitutional: She is oriented to person, place, and time. She appears well-developed and well-nourished. No distress.  Through interpreter  HENT:  Head: Normocephalic and atraumatic.  Right Ear: External ear normal.  Left Ear: External ear normal.  Mouth/Throat: Oropharynx is clear and moist. No oropharyngeal exudate.  Eyes: Conjunctivae and EOM are normal. Pupils are equal, round, and reactive to light. Right eye exhibits no discharge. Left eye exhibits no discharge. No scleral icterus.  Neck: Normal range of motion. Neck supple. No JVD present. No tracheal deviation present. No thyromegaly present.  Cardiovascular: Normal rate, regular rhythm, normal heart sounds and intact distal pulses.  Exam reveals no gallop and no friction rub.   No murmur heard. Pulmonary/Chest: Effort normal and breath sounds normal. No respiratory distress. She has no wheezes. She has no rales. She exhibits no tenderness.  Abdominal: Soft. Bowel sounds are normal. She exhibits no distension and no mass. There is no tenderness. There is no rebound and no guarding.  Musculoskeletal: Normal range of motion. She exhibits no edema or tenderness.  Lymphadenopathy:    She has no cervical adenopathy.  Neurological: She is alert and oriented to person, place, and time. She has normal reflexes. No cranial nerve deficit. She exhibits normal muscle tone. Coordination normal.  Skin: Skin is warm and dry. No rash noted. She is not diaphoretic. No erythema. No pallor.  Psychiatric: She has a normal mood and affect. Her  behavior is normal. Judgment and thought content normal.  Vitals reviewed.   Vitals:   05/18/16 0959  BP: 122/80  Pulse: 70  SpO2: 95%  Weight: 150 lb 6.4 oz (68.2 kg)  Height: 4\' 11"  (1.499 m)    Estimated body mass index is 30.38 kg/m as calculated from the following:   Height as of this encounter: 4\' 11"  (1.499 m).   Weight as of this encounter: 150 lb 6.4 oz (68.2 kg).       Assessment & Plan:     ICD-9-CM ICD-10-CM   1. Dyspnea and respiratory abnormality 786.09 R06.00     R06.89   2. Cough 786.2 R05   3. Hemoptysis 786.30 R04.2   4. Exposure to smoke from wood stove V87.39 Z77.29     I think most likely she has asthma related to wood stove exposure. However the hemoptysis is a little bit confusing and therefore sinister pathologies need to be ruled out. With a language barrier she will not be able to do pulmonary function test. She cannot even do an exhaled nitric oxide test actually. Therefore we'll have to rely on imaging and blood work. We'll get a d-dimer blood test at this is positive we'll look for pulmonary embolism. Negative we'll do  a high-resolution CT chest rule out interstitial lung disease. Meanwhile we can try empiric inhaled corticosteroid/long-acting beta agonist  Daughter-in-law agreeable with the plan  D d-dimer blood test 05/18/2016 - rule out blood clot  Depending these results will order specific type of CT chest  - HRCT if negative   - CTA if test is positive   STart breo 100 dose 1 puff daily - take sample if we have one Albuterol as needed    Dr. Kalman Shan, M.D., [facility].C.P Pulmonary and Critical Care Medicine Staff Physician [facility] [place] [facility]monary and Critical Care Pager: [phone], If no answer or between  15:00h - 7:00h: call [phone]  05/18/2016 10:47 AM

## 2016-05-18 NOTE — Progress Notes (Signed)
Patient seen in the office today and instructed on use of Breo 100.  Patient expressed understanding and demonstrated technique. Alyssa LoronCherina Shara Hartis [facility] 05/18/16

## 2016-05-18 NOTE — Patient Instructions (Addendum)
ICD-9-CM ICD-10-CM   1. Dyspnea and respiratory abnormality 786.09 R06.00     R06.89   2. Cough 786.2 R05   3. Hemoptysis 786.30 R04.2    D d-dimer blood test 05/18/2016 - rule out blood clot  Depending these results will order specific type of CT chest  - HRCT if negative   - CTA if test is positive   STart breo 100 dose 1 puff daily - take sample if we have one Albuterol as needed

## 2016-05-18 NOTE — Addendum Note (Signed)
Addended by: Maurene CapesPOTTS, Trenae Brunke M on: 05/18/2016 10:57 AM   Modules accepted: Orders

## 2016-05-18 NOTE — Telephone Encounter (Signed)
No treatment needed for UC per Jamie Ward PAC 

## 2016-05-19 ENCOUNTER — Encounter: Payer: Self-pay | Admitting: Family Medicine

## 2016-05-19 LAB — PAP IG AND HPV HIGH-RISK
HPV, HIGH-RISK: NEGATIVE
PAP SMEAR COMMENT: 0

## 2016-05-21 ENCOUNTER — Telehealth: Payer: Self-pay | Admitting: Internal Medicine

## 2016-05-21 DIAGNOSIS — R0602 Shortness of breath: Secondary | ICD-10-CM

## 2016-05-21 DIAGNOSIS — R059 Cough, unspecified: Secondary | ICD-10-CM

## 2016-05-21 DIAGNOSIS — R05 Cough: Secondary | ICD-10-CM

## 2016-05-21 NOTE — Telephone Encounter (Signed)
Called and spoke to pt's daughter in law. Informed her of the results and recs per MR. Order placed for HRCT. Pt's daughter in law verbalized understanding and denied any further questions or concerns at this time.

## 2016-05-21 NOTE — Telephone Encounter (Signed)
Let Alyssa Lyons daughter in law (patient does not speak english) know that d-dimer blood clos test was normal. So get HRCT chest wo contrast supine and prone. Indication: dyspnea and cough  Dr. Kalman ShanMurali [name], M.D., [facility].C.C.P Pulmonary and Critical Care Medicine Staff Physician [facility]  Pulmonary and Critical Care Pager: [phone], If no answer or between  15:00h - 7:00h: call [phone]  05/21/2016 8:05 AM

## 2016-05-24 ENCOUNTER — Telehealth (HOSPITAL_COMMUNITY): Payer: Self-pay | Admitting: *Deleted

## 2016-05-24 NOTE — Addendum Note (Signed)
Addended by: Burnetta SabinWITTY, Ervey Fallin K on: 05/24/2016 08:39 AM   Modules accepted: Orders

## 2016-05-24 NOTE — Telephone Encounter (Signed)
Patient's daughter's per DPR, given detailed instructions per Myocardial Perfusion Study Information Sheet for the test on 05/29/16 Patient notified to arrive 15 minutes early and that it is imperative to arrive on time for appointment to keep from having the test rescheduled.  If you need to cancel or reschedule your appointment, please call the office within 24 hours of your appointment. Failure to do so may result in a cancellation of your appointment, and a $50 no show fee. Patient verbalized understanding.Ricky AlaSmith, Alleen Kehm Jacqueline

## 2016-05-28 ENCOUNTER — Encounter (HOSPITAL_COMMUNITY): Payer: BLUE CROSS/BLUE SHIELD

## 2016-05-28 ENCOUNTER — Ambulatory Visit (INDEPENDENT_AMBULATORY_CARE_PROVIDER_SITE_OTHER)
Admission: RE | Admit: 2016-05-28 | Discharge: 2016-05-28 | Disposition: A | Discharge status: Home / Self Care | Payer: BLUE CROSS/BLUE SHIELD | Source: Ambulatory Visit | Attending: Internal Medicine | Admitting: Internal Medicine

## 2016-05-28 DIAGNOSIS — R0602 Shortness of breath: Secondary | ICD-10-CM

## 2016-05-28 DIAGNOSIS — R05 Cough: Secondary | ICD-10-CM | POA: Diagnosis not present

## 2016-05-28 DIAGNOSIS — R059 Cough, unspecified: Secondary | ICD-10-CM

## 2016-05-29 ENCOUNTER — Other Ambulatory Visit: Payer: Self-pay

## 2016-05-29 ENCOUNTER — Ambulatory Visit (HOSPITAL_BASED_OUTPATIENT_CLINIC_OR_DEPARTMENT_OTHER): Payer: BLUE CROSS/BLUE SHIELD

## 2016-05-29 ENCOUNTER — Ambulatory Visit (HOSPITAL_COMMUNITY): Payer: BLUE CROSS/BLUE SHIELD | Attending: Cardiology

## 2016-05-29 DIAGNOSIS — R079 Chest pain, unspecified: Secondary | ICD-10-CM

## 2016-05-29 DIAGNOSIS — R9431 Abnormal electrocardiogram [ECG] [EKG]: Secondary | ICD-10-CM

## 2016-05-29 DIAGNOSIS — R0602 Shortness of breath: Secondary | ICD-10-CM | POA: Diagnosis not present

## 2016-05-29 LAB — MYOCARDIAL PERFUSION IMAGING
CHL CUP NUCLEAR SDS: 3
CHL CUP NUCLEAR SRS: 3
CHL CUP NUCLEAR SSS: 4
CSEPPHR: 92 {beats}/min
LV dias vol: 59 mL (ref 46–106)
LV sys vol: 22 mL
RATE: 0.3
Rest HR: 54 {beats}/min
TID: 1.02

## 2016-05-29 MED ORDER — REGADENOSON 0.4 MG/5ML IV SOLN
0.4000 mg | Freq: Once | INTRAVENOUS | Status: AC
  Administered 2016-05-29: 0.4 mg via INTRAVENOUS

## 2016-05-29 MED ORDER — TECHNETIUM TC 99M TETROFOSMIN IV KIT
32.5000 | PACK | Freq: Once | INTRAVENOUS | Status: AC | PRN
  Administered 2016-05-29: 32.5 via INTRAVENOUS
  Filled 2016-05-29: qty 33

## 2016-05-29 MED ORDER — TECHNETIUM TC 99M TETROFOSMIN IV KIT
10.1000 | PACK | Freq: Once | INTRAVENOUS | Status: AC | PRN
Start: 2016-05-29 — End: 2016-05-29
  Administered 2016-05-29: 10.1 via INTRAVENOUS
  Filled 2016-05-29: qty 11

## 2016-05-29 NOTE — Progress Notes (Unsigned)
[facility] Interpreter provided present- Rea CollegeAnwaza

## 2016-05-30 ENCOUNTER — Telehealth: Payer: Self-pay | Admitting: Physician Assistant

## 2016-05-30 NOTE — Telephone Encounter (Signed)
[place], has been made aware of pts echo and stress test results and she verbalized appreciation and understanding.

## 2016-05-30 NOTE — Telephone Encounter (Signed)
Returning call about test results .Marland Kitchen.Please call

## 2016-05-30 NOTE — Telephone Encounter (Signed)
-----   Message from Dyann KiefMichele M Lenze, PA-C sent at 05/30/2016 10:00 AM EST ----- Normal 2-D echo with normal heart function and only mild murmur

## 2016-06-08 ENCOUNTER — Telehealth: Payer: Self-pay | Admitting: Internal Medicine

## 2016-06-08 NOTE — Telephone Encounter (Signed)
Pt aware of results and voiced her understanding. Nothing further needed.  

## 2016-06-08 NOTE — Telephone Encounter (Signed)
Let Alyssa Lyons know tht CT chest is clear and normal  Dr. Kalman ShanMurali Tamas Suen, M.D., [facility].C.P Pulmonary and Critical Care Medicine Staff Physician [place] [place] Pulmonary and Critical Care Pager: [phone], If no answer or between  15:00h - 7:00h: call [phone]  06/08/2016 2:19 PM

## 2016-07-11 ENCOUNTER — Encounter: Payer: Self-pay | Admitting: Family Medicine

## 2017-03-03 ENCOUNTER — Emergency Department (HOSPITAL_COMMUNITY): Payer: BLUE CROSS/BLUE SHIELD

## 2017-03-03 ENCOUNTER — Encounter (HOSPITAL_COMMUNITY): Payer: Self-pay

## 2017-03-03 DIAGNOSIS — E039 Hypothyroidism, unspecified: Secondary | ICD-10-CM | POA: Diagnosis not present

## 2017-03-03 DIAGNOSIS — R61 Generalized hyperhidrosis: Secondary | ICD-10-CM | POA: Diagnosis not present

## 2017-03-03 DIAGNOSIS — Z79899 Other long term (current) drug therapy: Secondary | ICD-10-CM | POA: Insufficient documentation

## 2017-03-03 DIAGNOSIS — R0609 Other forms of dyspnea: Secondary | ICD-10-CM | POA: Diagnosis not present

## 2017-03-03 DIAGNOSIS — R110 Nausea: Secondary | ICD-10-CM | POA: Insufficient documentation

## 2017-03-03 DIAGNOSIS — R0602 Shortness of breath: Secondary | ICD-10-CM | POA: Diagnosis not present

## 2017-03-03 DIAGNOSIS — R079 Chest pain, unspecified: Secondary | ICD-10-CM | POA: Diagnosis present

## 2017-03-03 DIAGNOSIS — I10 Essential (primary) hypertension: Secondary | ICD-10-CM | POA: Insufficient documentation

## 2017-03-03 DIAGNOSIS — R1013 Epigastric pain: Secondary | ICD-10-CM | POA: Diagnosis not present

## 2017-03-03 LAB — I-STAT TROPONIN, ED: TROPONIN I, POC: 0 ng/mL (ref 0.00–0.08)

## 2017-03-03 LAB — CBC
HEMATOCRIT: 44.7 % (ref 36.0–46.0)
Hemoglobin: 15 g/dL (ref 12.0–15.0)
MCH: 29.6 pg (ref 26.0–34.0)
MCHC: 33.6 g/dL (ref 30.0–36.0)
MCV: 88.2 fL (ref 78.0–100.0)
PLATELETS: 178 10*3/uL (ref 150–400)
RBC: 5.07 MIL/uL (ref 3.87–5.11)
RDW: 12.3 % (ref 11.5–15.5)
WBC: 7.8 10*3/uL (ref 4.0–10.5)

## 2017-03-03 LAB — BASIC METABOLIC PANEL
Anion gap: 6 (ref 5–15)
BUN: 13 mg/dL (ref 6–20)
CHLORIDE: 109 mmol/L (ref 101–111)
CO2: 24 mmol/L (ref 22–32)
Calcium: 9.3 mg/dL (ref 8.9–10.3)
Creatinine, Ser: 0.65 mg/dL (ref 0.44–1.00)
GFR calc Af Amer: >60 mL/min (ref >60)
GFR calc non Af Amer: >60 mL/min (ref >60)
GLUCOSE: 182 mg/dL — AB (ref 65–99)
POTASSIUM: 3.7 mmol/L (ref 3.5–5.1)
SODIUM: 139 mmol/L (ref 135–145)

## 2017-03-03 NOTE — ED Triage Notes (Signed)
Onset 1 hour PTA underneath right breast and epigastric area and shortness of breath.

## 2017-03-04 ENCOUNTER — Emergency Department (HOSPITAL_COMMUNITY)
Admission: EM | Admit: 2017-03-04 | Discharge: 2017-03-04 | Disposition: A | Discharge status: Home / Self Care | Payer: BLUE CROSS/BLUE SHIELD | Attending: Emergency Medicine | Admitting: Emergency Medicine

## 2017-03-04 DIAGNOSIS — R0602 Shortness of breath: Secondary | ICD-10-CM

## 2017-03-04 DIAGNOSIS — R079 Chest pain, unspecified: Secondary | ICD-10-CM

## 2017-03-04 DIAGNOSIS — R0609 Other forms of dyspnea: Secondary | ICD-10-CM

## 2017-03-04 LAB — I-STAT TROPONIN, ED: TROPONIN I, POC: 0 ng/mL (ref 0.00–0.08)

## 2017-03-04 NOTE — ED Notes (Signed)
[facility] and then on the way home experienced chest pressure. Pt reports pain free at this time.
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
## 2017-03-04 NOTE — Discharge Instructions (Signed)
1. Medications: usual home medications 2. Treatment: rest, drink plenty of fluids,  3. Follow Up: Please followup with your cardiology and pulmonology in 3-5 days for discussion of your diagnoses and further evaluation after today's visit; if you do not have a primary care doctor use the resource guide provided to find one; Please return to the ER for return of pain or shortness of breath, other concerns.

## 2017-03-22 ENCOUNTER — Emergency Department (HOSPITAL_COMMUNITY)
Admission: EM | Admit: 2017-03-22 | Discharge: 2017-03-22 | Disposition: A | Discharge status: Home / Self Care | Payer: BLUE CROSS/BLUE SHIELD | Attending: Emergency Medicine | Admitting: Emergency Medicine

## 2017-03-22 ENCOUNTER — Other Ambulatory Visit: Payer: Self-pay

## 2017-03-22 ENCOUNTER — Emergency Department (HOSPITAL_COMMUNITY): Payer: BLUE CROSS/BLUE SHIELD

## 2017-03-22 ENCOUNTER — Encounter (HOSPITAL_COMMUNITY): Payer: Self-pay

## 2017-03-22 DIAGNOSIS — E039 Hypothyroidism, unspecified: Secondary | ICD-10-CM | POA: Diagnosis not present

## 2017-03-22 DIAGNOSIS — R1011 Right upper quadrant pain: Secondary | ICD-10-CM | POA: Diagnosis present

## 2017-03-22 DIAGNOSIS — Z79899 Other long term (current) drug therapy: Secondary | ICD-10-CM | POA: Diagnosis not present

## 2017-03-22 DIAGNOSIS — I10 Essential (primary) hypertension: Secondary | ICD-10-CM | POA: Diagnosis not present

## 2017-03-22 DIAGNOSIS — R109 Unspecified abdominal pain: Secondary | ICD-10-CM

## 2017-03-22 LAB — CBC
HEMATOCRIT: 44.3 % (ref 36.0–46.0)
Hemoglobin: 15.1 g/dL — ABNORMAL HIGH (ref 12.0–15.0)
MCH: 30 pg (ref 26.0–34.0)
MCHC: 34.1 g/dL (ref 30.0–36.0)
MCV: 88.1 fL (ref 78.0–100.0)
Platelets: 217 10*3/uL (ref 150–400)
RBC: 5.03 MIL/uL (ref 3.87–5.11)
RDW: 12.2 % (ref 11.5–15.5)
WBC: 7.2 10*3/uL (ref 4.0–10.5)

## 2017-03-22 LAB — URINALYSIS, ROUTINE W REFLEX MICROSCOPIC
Bacteria, UA: NONE SEEN
Bilirubin Urine: NEGATIVE
Glucose, UA: NEGATIVE mg/dL
HGB URINE DIPSTICK: NEGATIVE
Ketones, ur: NEGATIVE mg/dL
Nitrite: NEGATIVE
Protein, ur: NEGATIVE mg/dL
SPECIFIC GRAVITY, URINE: 1.01 (ref 1.005–1.030)
Squamous Epithelial / LPF: NONE SEEN
pH: 7 (ref 5.0–8.0)

## 2017-03-22 LAB — HEPATIC FUNCTION PANEL
ALK PHOS: 90 U/L (ref 38–126)
ALT: 58 U/L — AB (ref 14–54)
AST: 53 U/L — AB (ref 15–41)
Albumin: 3.4 g/dL — ABNORMAL LOW (ref 3.5–5.0)
BILIRUBIN DIRECT: 0.2 mg/dL (ref 0.1–0.5)
BILIRUBIN TOTAL: 0.2 mg/dL — AB (ref 0.3–1.2)
Indirect Bilirubin: 0 mg/dL — ABNORMAL LOW (ref 0.3–0.9)
Total Protein: 7 g/dL (ref 6.5–8.1)

## 2017-03-22 LAB — BASIC METABOLIC PANEL
Anion gap: 9 (ref 5–15)
BUN: 9 mg/dL (ref 6–20)
CHLORIDE: 106 mmol/L (ref 101–111)
CO2: 25 mmol/L (ref 22–32)
Calcium: 9.6 mg/dL (ref 8.9–10.3)
Creatinine, Ser: 0.64 mg/dL (ref 0.44–1.00)
GFR calc Af Amer: >60 mL/min (ref >60)
GFR calc non Af Amer: >60 mL/min (ref >60)
Glucose, Bld: 94 mg/dL (ref 65–99)
POTASSIUM: 4.2 mmol/L (ref 3.5–5.1)
SODIUM: 140 mmol/L (ref 135–145)

## 2017-03-22 LAB — LIPASE, BLOOD: Lipase: 31 U/L (ref 11–51)

## 2017-03-22 MED ORDER — CHLORPHENIRAMINE-PHENYLEPHRINE 1-3.5 MG/ML PO LIQD: 0.7500 mL | Freq: Four times a day (QID) | ORAL | 0 refills | Status: DC | PRN

## 2017-03-22 MED ORDER — KETOROLAC TROMETHAMINE 30 MG/ML IJ SOLN
15.0000 mg | Freq: Once | INTRAMUSCULAR | Status: AC
  Administered 2017-03-22: 15 mg via INTRAMUSCULAR
  Filled 2017-03-22: qty 1

## 2017-03-22 MED ORDER — IBUPROFEN 400 MG PO TABS: 400.0000 mg | ORAL_TABLET | Freq: Three times a day (TID) | ORAL | 0 refills | Status: AC

## 2017-03-22 MED ORDER — FLUTICASONE FUROATE-VILANTEROL 100-25 MCG/INH IN AEPB: 1.0000 | INHALATION_SPRAY | Freq: Every day | RESPIRATORY_TRACT | 0 refills | Status: DC

## 2017-03-22 NOTE — Discharge Instructions (Signed)
As discussed, your evaluation today has been largely reassuring.  But, it is important that you monitor your condition carefully, and do not hesitate to return to the ED if you develop new, or concerning changes in your condition. ? ?Otherwise, please follow-up with your physician for appropriate ongoing care. ? ?

## 2017-03-22 NOTE — ED Notes (Signed)
ED Provider at bedside. 

## 2017-03-22 NOTE — ED Triage Notes (Signed)
Per Pt, Pt is coming from home with one week of right flank pain. Pt has tried ice, heat, and OTC medications with no relief. Reports the pain wraps around into the groin. Denies burning with urination.
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
## 2017-09-05 ENCOUNTER — Emergency Department (HOSPITAL_COMMUNITY): Payer: Self-pay

## 2017-09-05 ENCOUNTER — Emergency Department (HOSPITAL_COMMUNITY)
Admission: EM | Admit: 2017-09-05 | Discharge: 2017-09-05 | Disposition: A | Discharge status: Home / Self Care | Payer: Self-pay | Attending: Emergency Medicine | Admitting: Emergency Medicine

## 2017-09-05 ENCOUNTER — Other Ambulatory Visit: Payer: Self-pay

## 2017-09-05 ENCOUNTER — Encounter (HOSPITAL_COMMUNITY): Payer: Self-pay | Admitting: Emergency Medicine

## 2017-09-05 DIAGNOSIS — R531 Weakness: Secondary | ICD-10-CM | POA: Insufficient documentation

## 2017-09-05 DIAGNOSIS — N3000 Acute cystitis without hematuria: Secondary | ICD-10-CM | POA: Insufficient documentation

## 2017-09-05 DIAGNOSIS — M5416 Radiculopathy, lumbar region: Secondary | ICD-10-CM | POA: Insufficient documentation

## 2017-09-05 DIAGNOSIS — I10 Essential (primary) hypertension: Secondary | ICD-10-CM | POA: Insufficient documentation

## 2017-09-05 DIAGNOSIS — E039 Hypothyroidism, unspecified: Secondary | ICD-10-CM | POA: Insufficient documentation

## 2017-09-05 DIAGNOSIS — R197 Diarrhea, unspecified: Secondary | ICD-10-CM | POA: Insufficient documentation

## 2017-09-05 DIAGNOSIS — Z79899 Other long term (current) drug therapy: Secondary | ICD-10-CM | POA: Insufficient documentation

## 2017-09-05 DIAGNOSIS — R079 Chest pain, unspecified: Secondary | ICD-10-CM | POA: Insufficient documentation

## 2017-09-05 LAB — I-STAT TROPONIN, ED: TROPONIN I, POC: 0 ng/mL (ref 0.00–0.08)

## 2017-09-05 LAB — URINALYSIS, ROUTINE W REFLEX MICROSCOPIC
Bilirubin Urine: NEGATIVE
Glucose, UA: NEGATIVE mg/dL
Hgb urine dipstick: NEGATIVE
KETONES UR: NEGATIVE mg/dL
NITRITE: NEGATIVE
PH: 8 (ref 5.0–8.0)
Protein, ur: NEGATIVE mg/dL
SPECIFIC GRAVITY, URINE: 1.012 (ref 1.005–1.030)

## 2017-09-05 LAB — COMPREHENSIVE METABOLIC PANEL
ALK PHOS: 87 U/L (ref 38–126)
ALT: 45 U/L (ref 14–54)
ANION GAP: 5 (ref 5–15)
AST: 42 U/L — ABNORMAL HIGH (ref 15–41)
Albumin: 3.6 g/dL (ref 3.5–5.0)
BUN: 6 mg/dL (ref 6–20)
CALCIUM: 9.6 mg/dL (ref 8.9–10.3)
CO2: 28 mmol/L (ref 22–32)
CREATININE: 0.69 mg/dL (ref 0.44–1.00)
Chloride: 107 mmol/L (ref 101–111)
Glucose, Bld: 91 mg/dL (ref 65–99)
Potassium: 4.1 mmol/L (ref 3.5–5.1)
SODIUM: 140 mmol/L (ref 135–145)
TOTAL PROTEIN: 7.3 g/dL (ref 6.5–8.1)
Total Bilirubin: 0.8 mg/dL (ref 0.3–1.2)

## 2017-09-05 LAB — CBC WITH DIFFERENTIAL/PLATELET
Abs Immature Granulocytes: 0 10*3/uL (ref 0.0–0.1)
BASOS ABS: 0 10*3/uL (ref 0.0–0.1)
BASOS PCT: 0 %
EOS PCT: 2 %
Eosinophils Absolute: 0.2 10*3/uL (ref 0.0–0.7)
HCT: 47.2 % — ABNORMAL HIGH (ref 36.0–46.0)
HEMOGLOBIN: 15.2 g/dL — AB (ref 12.0–15.0)
Immature Granulocytes: 0 %
LYMPHS PCT: 35 %
Lymphs Abs: 3.5 10*3/uL (ref 0.7–4.0)
MCH: 28.3 pg (ref 26.0–34.0)
MCHC: 32.2 g/dL (ref 30.0–36.0)
MCV: 87.9 fL (ref 78.0–100.0)
MONOS PCT: 9 %
Monocytes Absolute: 0.9 10*3/uL (ref 0.1–1.0)
Neutro Abs: 5.2 10*3/uL (ref 1.7–7.7)
Neutrophils Relative %: 54 %
Platelets: 194 10*3/uL (ref 150–400)
RBC: 5.37 MIL/uL — ABNORMAL HIGH (ref 3.87–5.11)
RDW: 12 % (ref 11.5–15.5)
WBC: 9.8 10*3/uL (ref 4.0–10.5)

## 2017-09-05 MED ORDER — HYDROCHLOROTHIAZIDE 12.5 MG PO CAPS
12.5000 mg | ORAL_CAPSULE | Freq: Every day | ORAL | Status: DC
  Administered 2017-09-05: 12.5 mg via ORAL
  Filled 2017-09-05: qty 1

## 2017-09-05 MED ORDER — CEPHALEXIN 500 MG PO CAPS: 500.0000 mg | ORAL_CAPSULE | Freq: Three times a day (TID) | ORAL | 0 refills | Status: DC

## 2017-09-05 MED ORDER — SODIUM CHLORIDE 0.9 % IV SOLN
1.0000 g | Freq: Once | INTRAVENOUS | Status: AC
  Administered 2017-09-05: 1 g via INTRAVENOUS
  Filled 2017-09-05: qty 10

## 2017-09-05 MED ORDER — SODIUM CHLORIDE 0.9 % IV BOLUS
1000.0000 mL | Freq: Once | INTRAVENOUS | Status: AC
  Administered 2017-09-05: 1000 mL via INTRAVENOUS

## 2017-09-05 MED ORDER — HYDROCODONE-ACETAMINOPHEN 5-325 MG PO TABS: 1.0000 | ORAL_TABLET | Freq: Four times a day (QID) | ORAL | 0 refills | Status: DC | PRN

## 2017-09-05 MED ORDER — MORPHINE SULFATE (PF) 4 MG/ML IV SOLN
4.0000 mg | Freq: Once | INTRAVENOUS | Status: AC
  Administered 2017-09-05: 4 mg via INTRAVENOUS
  Filled 2017-09-05: qty 1

## 2017-09-05 MED ORDER — METHYLPREDNISOLONE 4 MG PO TBPK: ORAL_TABLET | ORAL | 0 refills | Status: DC

## 2017-09-05 MED ORDER — METHYLPREDNISOLONE SODIUM SUCC 125 MG IJ SOLR
125.0000 mg | Freq: Once | INTRAMUSCULAR | Status: AC
Start: 2017-09-05 — End: 2017-09-05
  Administered 2017-09-05: 125 mg via INTRAVENOUS
  Filled 2017-09-05: qty 2

## 2017-09-05 NOTE — ED Notes (Signed)
Patient transported to X-ray 

## 2017-09-05 NOTE — ED Notes (Signed)
Patient transported to MRI 

## 2017-09-05 NOTE — ED Provider Notes (Signed)
Patient placed in Quick Look pathway, seen and evaluated   Chief Complaint: weakness HPI:   Weakness, incontinent of urine, diarrhea and back pain  ROS: nausea  Physical Exam:   Gen: No distress  Neuro: Awake and Alert  Skin: Warm    Focused Exam: wdwn, respiration normal. Heart rate normal   Initiation of care has begun. The patient has been counseled on the process, plan, and necessity for staying for the completion/evaluation, and the remainder of the medical screening examination    Alyssa Lyons 09/05/17 1657    Gerhard Munch, MD 09/06/17 1538

## 2017-09-05 NOTE — ED Triage Notes (Signed)
Triage done using interpreter. Pt reports lower back pain that radiates to L hip x1 week, urinary frequency. Pt reports intermittent incontinence of bowels and weakness x 1 month. Pt denies nausea, vomiting. P talert in no acute distress.

## 2017-09-05 NOTE — ED Notes (Signed)
ED Provider at bedside. 

## 2017-09-05 NOTE — ED Provider Notes (Signed)
[facility] EMERGENCY DEPARTMENT Provider Note   CSN: [id number] Arrival date & time: 09/05/17  1616     History   Chief Complaint Chief Complaint  Patient presents with  . Back Pain  . Weakness    HPI Alyssa Lyons is a 62 y.o. female hx of HTN, hypothyroidism, here presenting with chest pain, weakness, diarrhea, back pain, urinary incontinence.  Patient had intermittent diarrhea for the last several weeks.  Per the sister-in-law, the diarrhea comes and goes and sometimes is better and sometimes is worse.  Denies any blood in her diarrhea and denies any recent travel or eating uncooked meat.  She is fasting during the day due to religious holiday currently and states that when she does eat, she occasionally has some abdominal cramps.  She also has occasional chest pain that comes and goes as well.  For the last 2 weeks, she has constant lower back pain.  She states that the pain radiates down her left hip and down the left leg.  She states that she has pain when she walks but denies any numbness or weakness.  She also states that when she tries to urinate, she felt like her bladder does not empty and she occasionally urinates on herself.  She denies trouble defecating.   The history is provided by the patient. A language interpreter was used.  Sister in law translating   Past Medical History:  Diagnosis Date  . Hepatitis C   . Hypertension   . Thyroid disease     Patient Active Problem List   Diagnosis Date Noted  . Cough 05/18/2016  . Hemoptysis 05/18/2016  . Exposure to smoke from wood stove 05/18/2016  . Dizziness 05/17/2016  . Chest pain 05/15/2016  . Dyspnea and respiratory abnormality 05/15/2016  . Near syncope 05/15/2016  . Travel advice encounter 05/01/2016  . Hypothyroidism 05/01/2016    History reviewed. No pertinent surgical history.   OB History   None      Home Medications    Prior to Admission medications   Medication Sig Start  Date End Date Taking? Authorizing Provider  bisoprolol-hydrochlorothiazide (ZIAC) 5-6.25 MG tablet Take 1 tablet by mouth daily. Patient not taking: Reported on 03/22/2017 05/17/16   Dyann Kief, PA-C  chlorpheniramine-phenylephrine (CARDEC) 1-3.5 MG/ML LIQD Take 0.75 mLs by mouth 4 (four) times daily as needed. 03/22/17   Gerhard Munch, MD  fluticasone furoate-vilanterol (BREO ELLIPTA) 100-25 MCG/INH AEPB Inhale 1 puff into the lungs daily. 03/22/17   Gerhard Munch, MD  isosorbide mononitrate (IMDUR) 30 MG 24 hr tablet Take 30 mg by mouth daily. 03/05/17   [provider]  levothyroxine (SYNTHROID, LEVOTHROID) 75 MCG tablet Take 1 tablet (75 mcg total) by mouth daily before breakfast. 05/01/16   Chitanand, Alicia B, DO  nystatin ointment (MYCOSTATIN) Apply 1 application topically 2 (two) times daily. Patient not taking: Reported on 03/22/2017 05/11/16   Bing Neighbors, FNP    Family History Family History  Problem Relation Age of Onset  . Hypertension Mother   . Heart disease Mother   . Hypertension Father   . Heart disease Father   . Hyperlipidemia Brother   . Hypertension Brother   . Hypertension Brother   . Hyperlipidemia Brother   . Hyperlipidemia Brother   . Hypertension Brother     Social History Social History   Tobacco Use  . Smoking status: Never Smoker  . Smokeless tobacco: Never Used  Substance Use Topics  . Alcohol use:  No  . Drug use: No     Allergies   Patient has no known allergies.   Review of Systems Review of Systems  Musculoskeletal: Positive for back pain.  Neurological: Positive for weakness.  All other systems reviewed and are negative.    Physical Exam Updated Vital Signs BP (!) 167/88   Pulse 73   Temp 99.1 F (37.3 C) (Oral)   Resp (!) 22   SpO2 98%   Physical Exam  Constitutional: She is oriented to person, place, and time. She appears well-developed.  Slightly uncomfortable   HENT:  Head: Normocephalic.    Mouth/Throat: Oropharynx is clear and moist.  Eyes: Pupils are equal, round, and reactive to light. Conjunctivae and EOM are normal.  Neck: Normal range of motion. Neck supple.  Cardiovascular: Normal rate and regular rhythm.  Pulmonary/Chest: Effort normal and breath sounds normal. No stridor. No respiratory distress. She has no wheezes.  Abdominal: Soft. Bowel sounds are normal. She exhibits no distension. There is no tenderness.  Musculoskeletal: Normal range of motion.  Mild lumbar tenderness, nl ROM bilateral hips. + straight leg raise on L side   Neurological: She is alert and oriented to person, place, and time.  No saddle anesthesia. Nl strength and nl reflexes bilateral lower extremities. Nl gait   Skin: Skin is warm.  Psychiatric: She has a normal mood and affect.  Nursing note and vitals reviewed.    ED Treatments / Results  Labs (all labs ordered are listed, but only abnormal results are displayed) Labs Reviewed  CBC WITH DIFFERENTIAL/PLATELET - Abnormal; Notable for the following components:      Result Value   RBC 5.37 (*)    Hemoglobin 15.2 (*)    HCT 47.2 (*)    All other components within normal limits  COMPREHENSIVE METABOLIC PANEL - Abnormal; Notable for the following components:   AST 42 (*)    All other components within normal limits  URINALYSIS, ROUTINE W REFLEX MICROSCOPIC - Abnormal; Notable for the following components:   Leukocytes, UA SMALL (*)    Bacteria, UA FEW (*)    Non Squamous Epithelial 0-5 (*)    All other components within normal limits  I-STAT TROPONIN, ED    EKG EKG Interpretation  Date/Time:  Thursday Sep 05 2017 18:02:21 EDT Ventricular Rate:  78 PR Interval:    QRS Duration: 102 QT Interval:  392 QTC Calculation: 447 R Axis:   -26 Text Interpretation:  Sinus rhythm Borderline left axis deviation Anterolateral infarct, old Baseline wander in lead(s) V3 V4 V5 V6 No significant change since last tracing Confirmed by Richardean Canal  [phone]) on 09/05/2017 6:06:55 PM   Radiology Dg Chest 2 View  Result Date: 09/05/2017 CLINICAL DATA:  Intermittent low back pain. EXAM: CHEST - 2 VIEW COMPARISON:  03/22/2017 FINDINGS: AP and lateral views of the chest were obtained. The lungs are clear without focal pneumonia, edema, pneumothorax or pleural effusion. Interstitial markings are diffusely coarsened with chronic features. The cardio pericardial silhouette is enlarged. The visualized bony structures of the thorax are intact. IMPRESSION: No active cardiopulmonary disease. Electronically Signed   By: Kennith Center M.D.   On: 09/05/2017 19:42   Mr Lumbar Spine Wo Contrast  Result Date: 09/05/2017 CLINICAL DATA:  Back pain and urinary incontinence EXAM: MRI LUMBAR SPINE WITHOUT CONTRAST TECHNIQUE: Multiplanar, multisequence MR imaging of the lumbar spine was performed. No intravenous contrast was administered. COMPARISON:  None. FINDINGS: Segmentation: Normal. The lowest disc space is considered  to be L5-S1. Alignment:  Normal Vertebrae: No acute compression fracture, discitis-osteomyelitis of focal marrow lesion. Conus medullaris and cauda equina: The conus medullaris terminates at the T12 level. The cauda equina and conus medullaris are both normal. Paraspinal and other soft tissues: The visualized aorta, IVC and iliac vessels are normal. The visualized retroperitoneal organs and paraspinal soft tissues are normal. Disc levels: T10-L1 is visualized in the sagittal plane only, with no disc herniation or stenosis. L1-L2: Minimal disc desiccation.  No stenosis. L2-L3: Mild disc bulge without stenosis. L3-L4: Medium-sized disc bulge with narrowing of both lateral recesses and mild bilateral neural foraminal stenosis. No spinal canal stenosis. Mild facet hypertrophy. L4-L5: Disc desiccation with medium-sized diffuse bulge. Mild facet hypertrophy with moderate ligamentum flavum redundancy. Moderate spinal canal stenosis and severe bilateral neural  foraminal stenosis. L5-S1: Small central disc protrusion that may displaces the descending left S1 nerve root. 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 Moderate bilateral neural foraminal stenosis. The visualized sacrum is normal. IMPRESSION: 1. L4-L5 moderate spinal canal stenosis with severe bilateral neural foraminal stenosis. 2. L5-S1 small central disc protrusion slightly displacing the left S1 nerve root. Correlate for left S1 radiculopathy. Moderate bilateral neural foraminal stenosis at this level could also contribute to bilateral L5 distribution subtends. 3. L3-L4 disc bulge with mild narrowing of the lateral recesses and neural foramina. Electronically Signed   By: Deatra Robinson M.D.   On: 09/05/2017 19:08    Procedures Procedures (including critical care time)  Medications Ordered in ED Medications  hydrochlorothiazide (MICROZIDE) capsule 12.5 mg (12.5 mg Oral Given 09/05/17 1808)  sodium chloride 0.9 % bolus 1,000 mL (1,000 mLs Intravenous New Bag/Given 09/05/17 1807)  morphine 4 MG/ML injection 4 mg (4 mg Intravenous Given 09/05/17 1807)  methylPREDNISolone sodium succinate (SOLU-MEDROL) 125 mg/2 mL injection 125 mg (125 mg Intravenous Given 09/05/17 1951)  cefTRIAXone (ROCEPHIN) 1 g in sodium chloride 0.9 % 100 mL IVPB (1 g Intravenous New Bag/Given 09/05/17 1955)     Initial Impression / Assessment and Plan / ED Course  I have reviewed the triage vital signs and the nursing notes.  Pertinent labs & imaging results that were available during my care of the patient were reviewed by me and considered in my medical decision making (see chart for details).     Alyssa Lyons is a 62 y.o. female here with back pain, chest pain, trouble urinating, diarrhea. Her chest pain and diarrhea is intermittently for a month and I have low suspicion for PE or ACS or intra abdominal process. Her back pain is more acute. I think likely has radiculopathy but given urinary incontinence, will get  MRI lumbar spine. Will give pain meds and reassess.   8:30 PM Labs unremarkable. UA showed possible UTI. MRi lumbar spine showed S1 radiculopathy likely causing her symptoms. Given rocephin, solumedrol. Will dc home with keflex for UTI, medrol dose pack, neurosurgery follow up.   Final Clinical Impressions(s) / ED Diagnoses   Final diagnoses:  None    ED Discharge Orders    None       Charlynne Pander, MD 09/05/17 2031

## 2017-09-05 NOTE — ED Notes (Signed)
Pt brought back from triage; giving urine sample at this time.

## 2017-09-05 NOTE — Discharge Instructions (Addendum)
Take keflex as prescribed for urinary tract infection.   Take medrol dose pack for your back.   Take motrin 600 mg every 6 hrs for pain.   Take vicodin for severe pain.   See your doctor. See spine doctor for follow up for pinched nerve in your back   Return to ER if you have worse back pain, leg pain, trouble walking, trouble urinating, fever.

## 2017-09-06 ENCOUNTER — Ambulatory Visit: Payer: BLUE CROSS/BLUE SHIELD | Admitting: Physician Assistant

## 2017-11-30 ENCOUNTER — Encounter: Payer: Self-pay | Admitting: Internal Medicine

## 2017-11-30 ENCOUNTER — Ambulatory Visit: Payer: Self-pay | Admitting: Internal Medicine

## 2017-11-30 VITALS — BP 160/110 | HR 59 | Temp 97.2°F | Ht 61.0 in | Wt 152.0 lb

## 2017-11-30 DIAGNOSIS — I10 Essential (primary) hypertension: Secondary | ICD-10-CM

## 2017-11-30 DIAGNOSIS — R059 Cough, unspecified: Secondary | ICD-10-CM

## 2017-11-30 DIAGNOSIS — R05 Cough: Secondary | ICD-10-CM

## 2017-11-30 MED ORDER — BISOPROLOL-HYDROCHLOROTHIAZIDE 5-6.25 MG PO TABS: 1.0000 | ORAL_TABLET | Freq: Every day | ORAL | 3 refills | Status: DC

## 2017-11-30 MED ORDER — LOSARTAN POTASSIUM 50 MG PO TABS: 50.0000 mg | ORAL_TABLET | Freq: Every day | ORAL | 3 refills | Status: DC

## 2017-11-30 MED ORDER — FLUTICASONE PROPIONATE 50 MCG/ACT NA SUSP: 2.0000 | Freq: Every day | NASAL | 6 refills | Status: DC

## 2017-11-30 NOTE — Progress Notes (Signed)
Internal MEDICINE  Office Visit Note  Patient Name: Marcene CorningShafqat Bowsher  [id number] 27, 2057  [id number]  Date of Service: 11/30/2017  Chief Complaint  Patient presents with  . Hypertension  . Cough    coughing attacks; 10-15 minutes    HPI  Pt is here for routine follow up.    Current Medication: Outpatient Encounter Medications as of 11/30/2017  Medication Sig  . levothyroxine (SYNTHROID, LEVOTHROID) 75 MCG tablet Take 1 tablet (75 mcg total) by mouth daily before breakfast.  . losartan (COZAAR) 25 MG tablet Take by mouth daily.  . bisoprolol-hydrochlorothiazide (ZIAC) 5-6.25 MG tablet Take 1 tablet by mouth daily. (Patient not taking: Reported on 03/22/2017)  . cephALEXin (KEFLEX) 500 MG capsule Take 1 capsule (500 mg total) by mouth 3 (three) times daily. (Patient not taking: Reported on 11/30/2017)  . fluticasone furoate-vilanterol (BREO ELLIPTA) 100-25 MCG/INH AEPB Inhale 1 puff into the lungs daily. (Patient not taking: Reported on 11/30/2017)  . HYDROcodone-acetaminophen (NORCO/VICODIN) 5-325 MG tablet Take 1 tablet by mouth every 6 (six) hours as needed. (Patient not taking: Reported on 11/30/2017)  . nystatin ointment (MYCOSTATIN) Apply 1 application topically 2 (two) times daily. (Patient not taking: Reported on 03/22/2017)  . [DISCONTINUED] chlorpheniramine-phenylephrine (CARDEC) 1-3.5 MG/ML LIQD Take 0.75 mLs by mouth 4 (four) times daily as needed. (Patient not taking: Reported on 11/30/2017)  . [DISCONTINUED] isosorbide mononitrate (IMDUR) 30 MG 24 hr tablet Take 30 mg by mouth daily.  . [DISCONTINUED] methylPREDNISolone (MEDROL DOSEPAK) 4 MG TBPK tablet Use as directed (Patient not taking: Reported on 11/30/2017)   No facility-administered encounter medications on file as of 11/30/2017.     Surgical History: History reviewed. No pertinent surgical history.  Medical History: Past Medical History:  Diagnosis Date  . Hepatitis   . Hepatitis C   . Hypertension   . Thyroid disease      Family History: Family History  Problem Relation Age of Onset  . Hypertension Mother   . Heart disease Mother   . Hypertension Father   . Heart disease Father   . Hyperlipidemia Brother   . Hypertension Brother   . Hypertension Brother   . Hyperlipidemia Brother   . Hyperlipidemia Brother   . Hypertension Brother     Social History   Socioeconomic History  . Marital status: Married    Spouse name: Not on file  . Number of children: Not on file  . Years of education: Not on file  . Highest education level: Not on file  Occupational History  . Not on file  Social Needs  . Financial resource strain: Not on file  . Food insecurity:    Worry: Not on file    Inability: Not on file  . Transportation needs:    Medical: Not on file    Non-medical: Not on file  Tobacco Use  . Smoking status: Never Smoker  . Smokeless tobacco: Never Used  Substance and Sexual Activity  . Alcohol use: No  . Drug use: No  . Sexual activity: Not on file  Lifestyle  . Physical activity:    Days per week: Not on file    Minutes per session: Not on file  . Stress: Not on file  Relationships  . Social connections:    Talks on phone: Not on file    Gets together: Not on file    Attends religious service: Not on file    Active member of club or organization: Not on file    Attends meetings  of clubs or organizations: Not on file    Relationship status: Not on file  . Intimate partner violence:    Fear of current or ex partner: Not on file    Emotionally abused: Not on file    Physically abused: Not on file    Forced sexual activity: Not on file  Other Topics Concern  . Not on file  Social History Narrative  . Not on file      Review of Systems  Vital Signs: BP (!) 170/107   Pulse (!) 59   Temp (!) 97.2 F (36.2 C)   Ht 5\' 1"  (1.549 m)   Wt 152 lb (68.9 kg)   SpO2 (!) 86%   BMI 28.72 kg/m  blood pressure : repeat manual 160/110 Vitals:   11/30/17 0936  Weight: 152 lb  (68.9 kg)  Height: 5\' 1"  (1.549 m)     Physical Exam Lung clear RRR No edema   Assessment/Plan:  htn- increase losartan 50 mg  rf ziac  Cough: flonase and claritin   General Counseling: Lalani verbalizes understanding of the findings of todays visit and agrees with plan of treatment. I have discussed any further diagnostic evaluation that may be needed or ordered today. We also reviewed her medications today. she has been encouraged to call the office with any questions or concerns that should arise related to todays visit.    No orders of the defined types were placed in this encounter.   No orders of the defined types were placed in this encounter.       Johnanna Bakke Subjective:    Patient here for follow-up of elevated blood pressure.  She  exercising and  adherent to a low-salt diet.  Blood pressure  well controlled at home. Cardiac symptoms: . Patient denies: . Cardiovascular risk factors: . Use of agents associated with hypertension: . History of target organ damage: .    Review of Systems      Objective:        Assessment:    Hypertension,  ***. Evidence of target organ damage: .    Plan:

## 2017-11-30 NOTE — Patient Instructions (Signed)
Avoid ibuprofen or advil

## 2017-12-21 ENCOUNTER — Ambulatory Visit: Payer: Self-pay | Admitting: Internal Medicine

## 2018-01-04 ENCOUNTER — Encounter: Payer: Self-pay | Admitting: Internal Medicine

## 2018-01-04 ENCOUNTER — Ambulatory Visit: Payer: Self-pay | Admitting: Internal Medicine

## 2018-01-04 VITALS — BP 144/88 | HR 59 | Temp 97.7°F | Ht 59.0 in | Wt 150.0 lb

## 2018-01-04 DIAGNOSIS — I10 Essential (primary) hypertension: Secondary | ICD-10-CM

## 2018-01-04 DIAGNOSIS — E039 Hypothyroidism, unspecified: Secondary | ICD-10-CM

## 2018-01-04 NOTE — Progress Notes (Signed)
Alyssa Lyons is an 62 y.o. female.     Past Medical History:  Diagnosis Date  . Hepatitis   . Hepatitis C   . Hypertension   . Thyroid disease     Allergies: No Known Allergies   SUBJECTIVE: C/O being cold most of the time and having post nasal drip.   (Not in a hospital admission)  No results found for this or any previous visit (from the past 48 hour(s)). No results found.  ROS  As above  Blood pressure (!) 144/88, pulse (!) 59, temperature 97.7 F (36.5 C), height 4\' 11"  (1.499 m), weight 150 lb (68 kg), SpO2 98 %. Physical Exam   CV- RR PULM-CTAB GI- abdomen soft, non tender, BS+ Ext- no edema  Assessment/Plan -Patient has not had TSH checked in a while. Will check along with other routine labs - OTC Claritin for PND, using flonase already.  Kirt BoysJamila S Nasim Garofano, MD 01/04/2018, 11:49 AM

## 2018-01-18 ENCOUNTER — Ambulatory Visit: Payer: Self-pay | Admitting: Cardiovascular Disease

## 2018-02-12 ENCOUNTER — Encounter: Payer: Self-pay | Admitting: Internal Medicine

## 2018-02-15 ENCOUNTER — Ambulatory Visit: Payer: Self-pay | Admitting: Internal Medicine

## 2018-02-15 ENCOUNTER — Encounter: Payer: Self-pay | Admitting: Internal Medicine

## 2018-02-15 VITALS — BP 140/85 | HR 57 | Temp 97.8°F | Ht 59.0 in | Wt 150.6 lb

## 2018-02-15 DIAGNOSIS — E079 Disorder of thyroid, unspecified: Secondary | ICD-10-CM

## 2018-02-15 DIAGNOSIS — I10 Essential (primary) hypertension: Secondary | ICD-10-CM

## 2018-02-15 NOTE — Progress Notes (Signed)
Alyssa Lyons is an 62 y.o. female.   Chief Complaint: F/U Visit for HTN HPI: Patient without new complaints  Past Medical History:  Diagnosis Date  . Hepatitis   . Hepatitis C   . Hypertension   . Thyroid disease     No past surgical history on file.  Family History  Problem Relation Age of Onset  . Hypertension Mother   . Heart disease Mother   . Hypertension Father   . Heart disease Father   . Hyperlipidemia Brother   . Hypertension Brother   . Hypertension Brother   . Hyperlipidemia Brother   . Hyperlipidemia Brother   . Hypertension Brother    Social History:  reports that she has never smoked. She has never used smokeless tobacco. She reports that she does not drink alcohol or use drugs.  Allergies: No Known Allergies   (Not in a hospital admission)  No results found for this or any previous visit (from the past 48 hour(s)). No results found.  ROS  Blood pressure 140/85, pulse (!) 57, temperature 97.8 F (36.6 C), height 4\' 11"  (1.499 m), weight 150 lb 9.6 oz (68.3 kg), SpO2 98 %. Physical Exam   CV; RRR Pulm: CTA EXT;No edema  Assessment/Plan Continue same and F/U as discussed  Kirt Boys, MD 02/15/2018, 11:36 AM

## 2018-02-23 IMAGING — CR DG CHEST 2V
2 series · 2 of 2 positions shown · non-contrast
Comparison: 05/15/2016 at [DATE] a.m.

CLINICAL DATA: Through a family member translator, the patient did
not feel well all day yesterday. Pt tried drinking water and resting
however nothing helped. Pt again woke up this am after resting and
fell complaining pain to the left chest wall. Hx of Hep C, HTN,
thyroid disease. Non-Smoker

EXAM:
CHEST  2 VIEW

[chest ap]
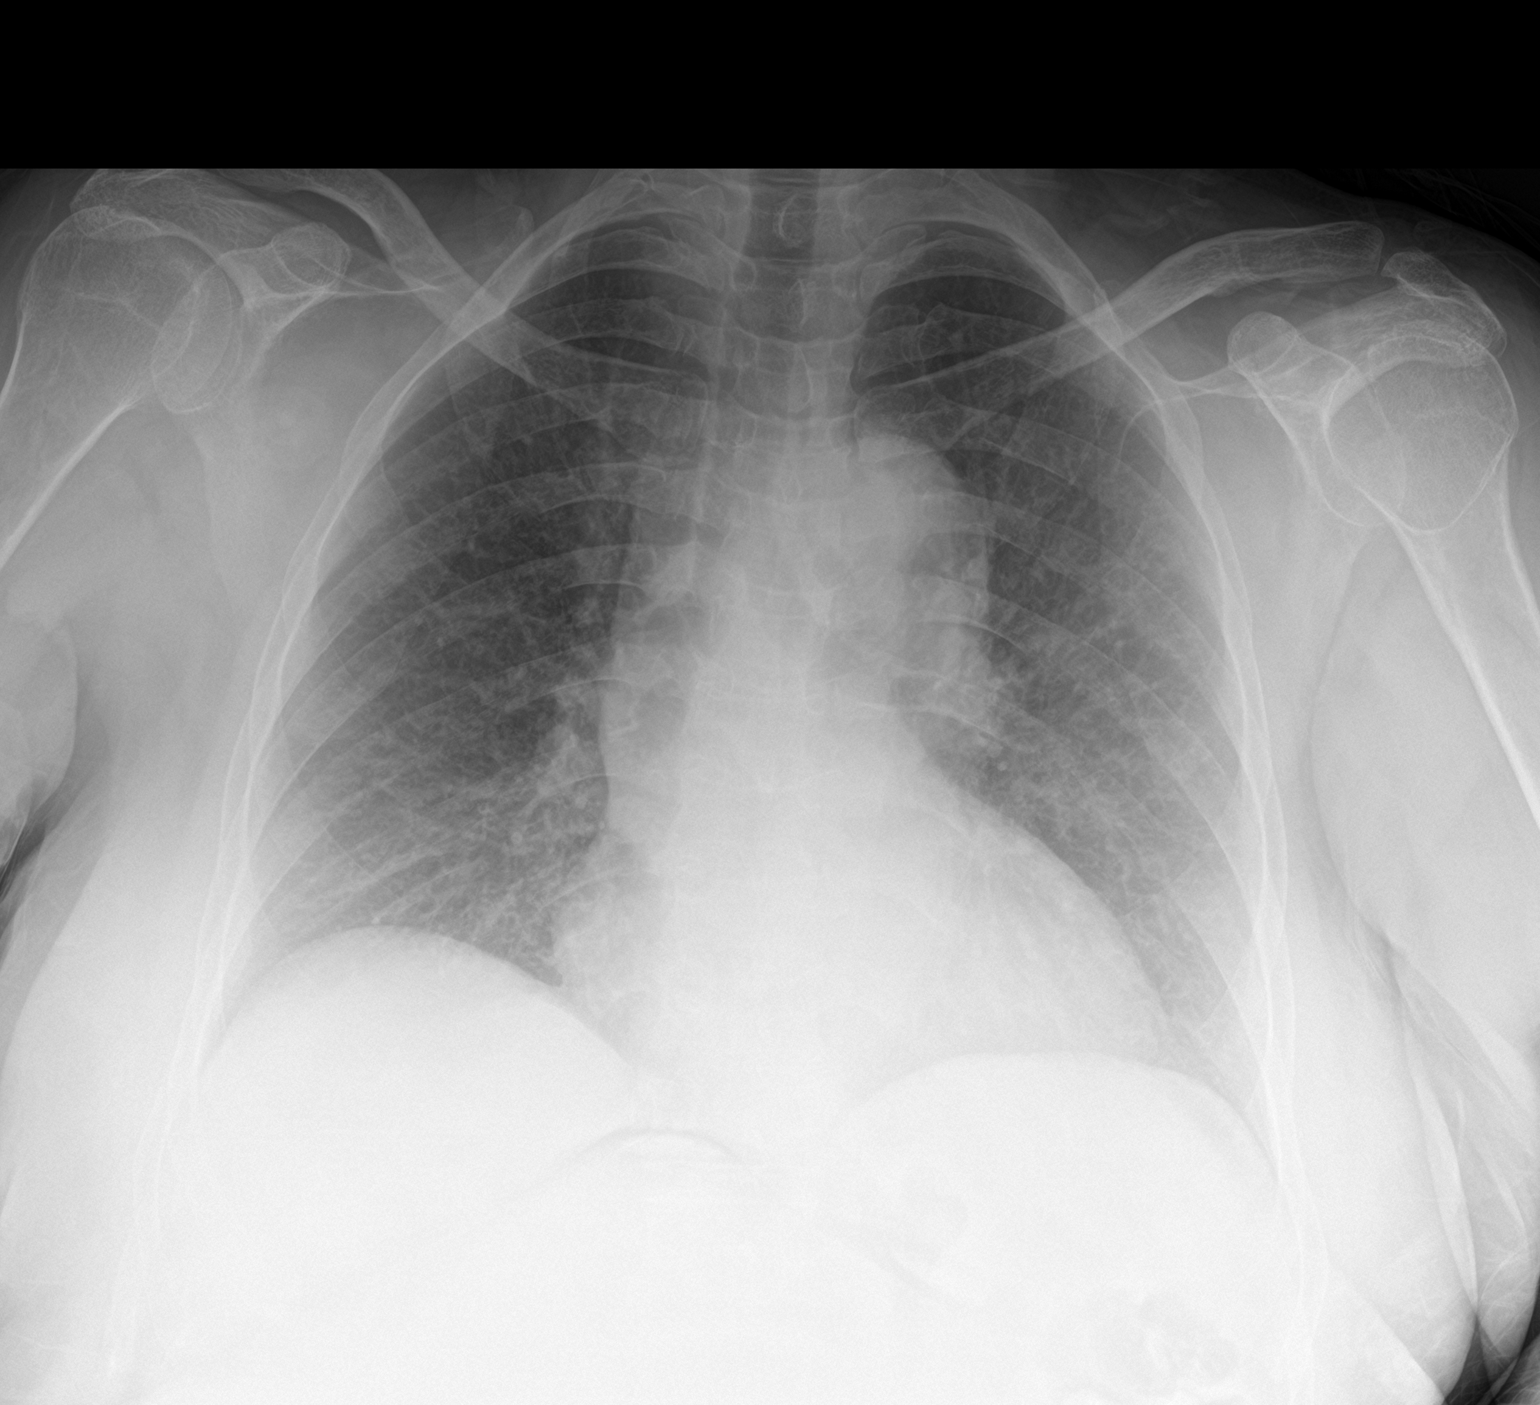

[chest lat]
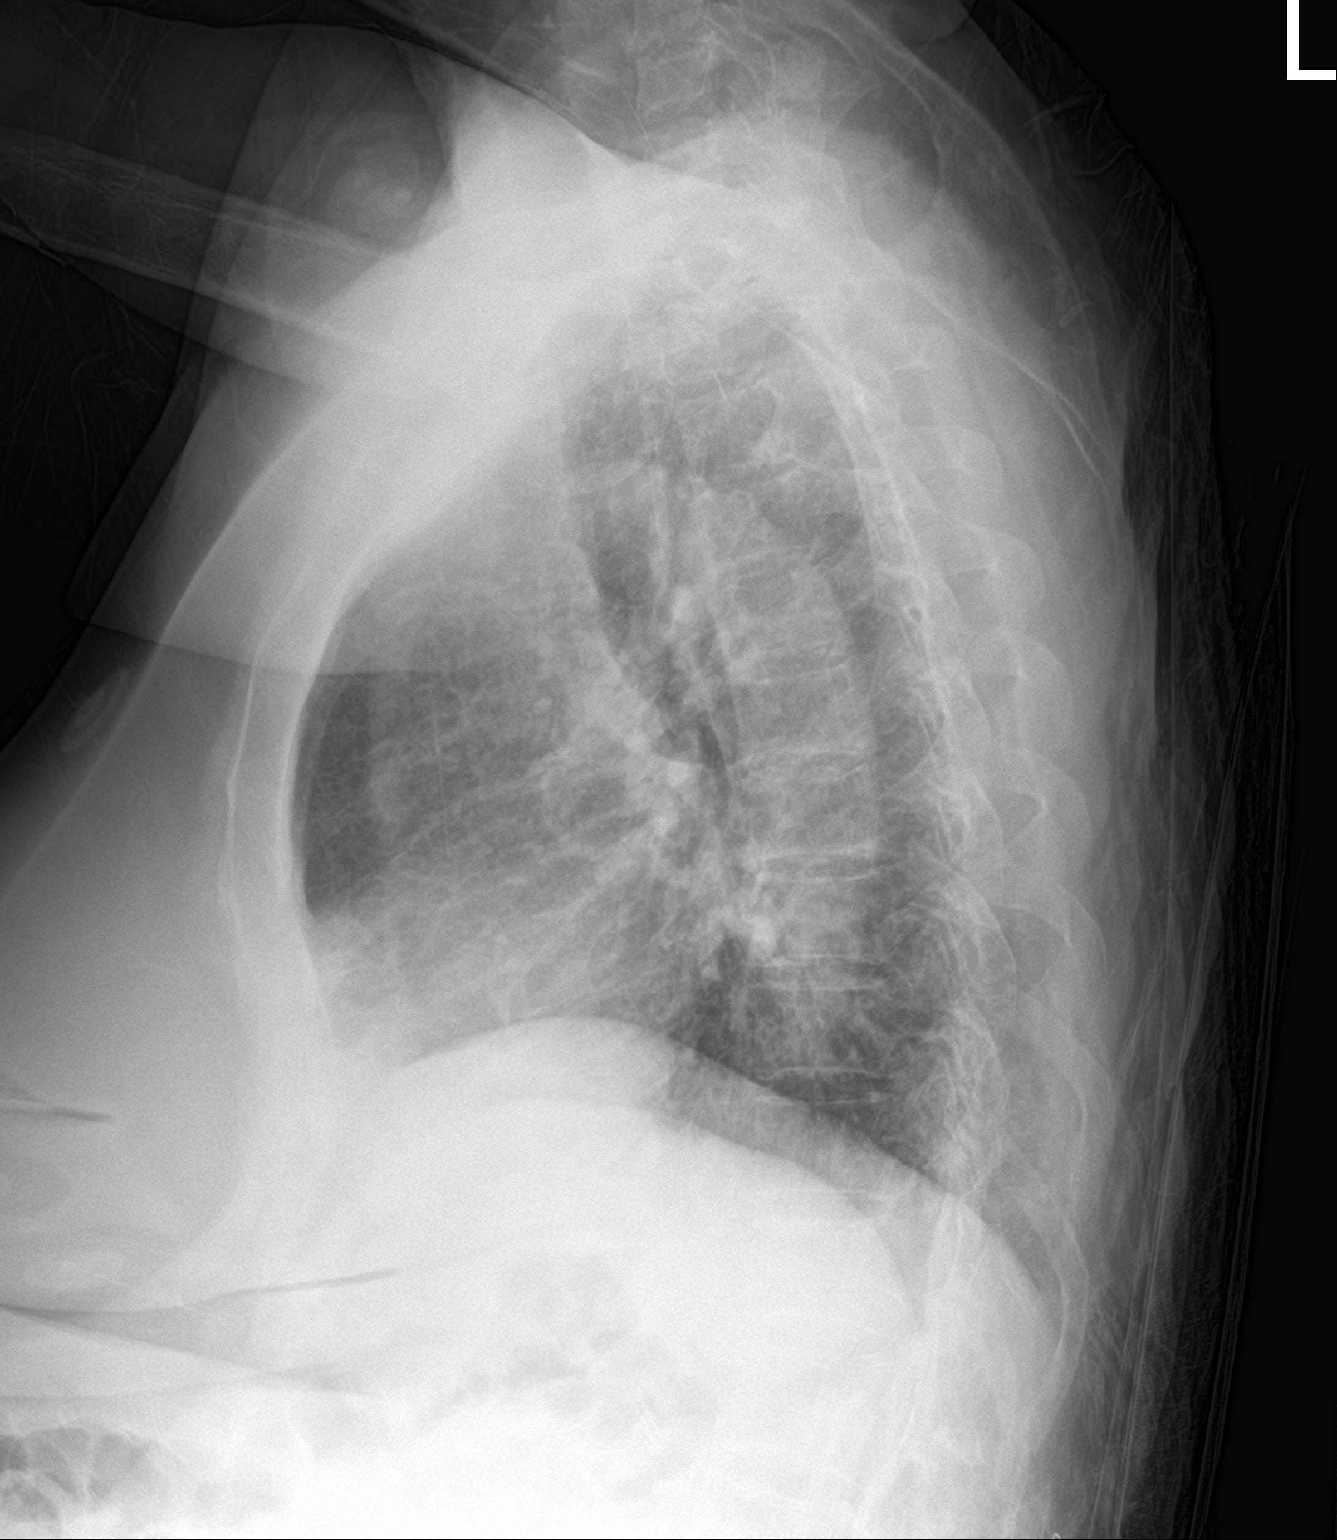

[2 of 2 positions shown; findings below may reference images not displayed]

FINDINGS: Cardiac silhouette is top-normal in size. No mediastinal or hilar
masses. No evidence of adenopathy.

There are prominent bronchovascular markings bilaterally similar to
the exam dated 05/11/2016. No lung consolidation. No convincing
pulmonary edema.

No pleural effusion or pneumothorax.

Skeletal structures are demineralized but grossly intact.
IMPRESSION: No acute cardiopulmonary disease.

## 2018-02-23 IMAGING — CT CT HEAD W/O CM
3 series · 15 of 47 positions shown, 18 images · non-contrast
Comparison: 11/03/2013

CLINICAL DATA: Walking to kitchen, fell onto R side. Pt complaining
of R lower rib pain. Pt denies LOC, denies head injury/trauma.

EXAM:
CT HEAD WITHOUT CONTRAST
TECHNIQUE: Contiguous axial images were obtained from the base of the skull
through the vertex without intravenous contrast.

[Series 4: head 5.0 h30s · axial · 0.43mm/px · z∈[-78,+52]mm · 9 of 32 slices shown, 12 images]
[im 3/32  brain]
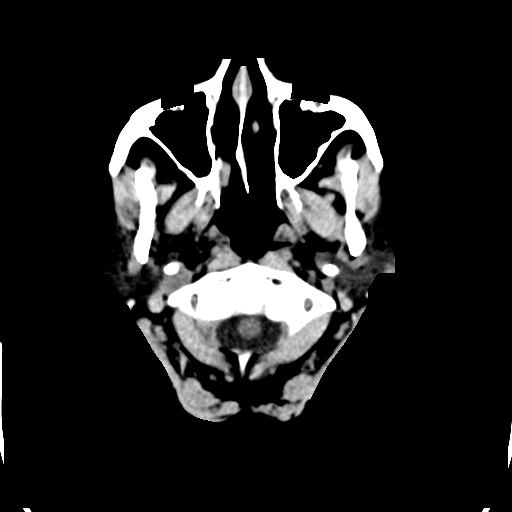
[im 3/32  bone]
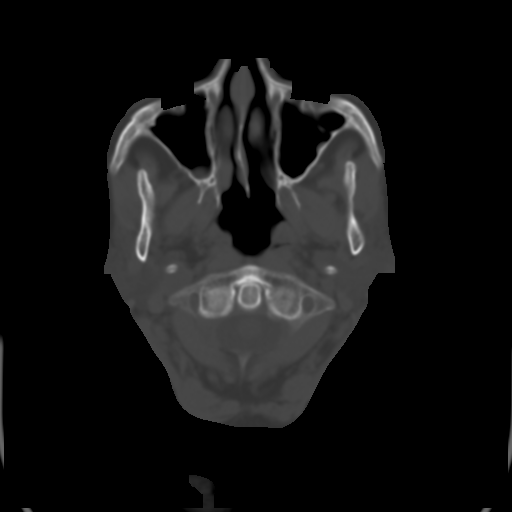
[im 6/32  brain]
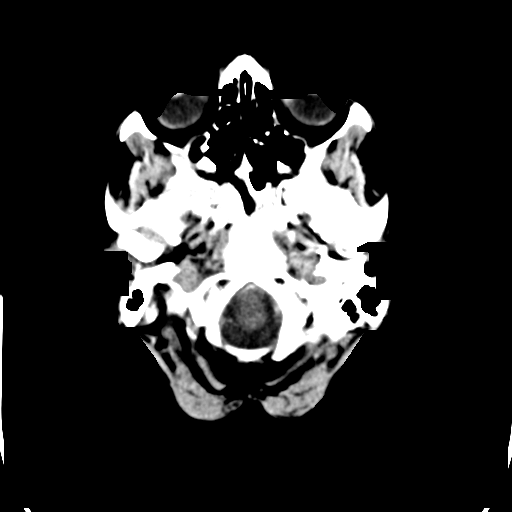
[im 9/32  brain]
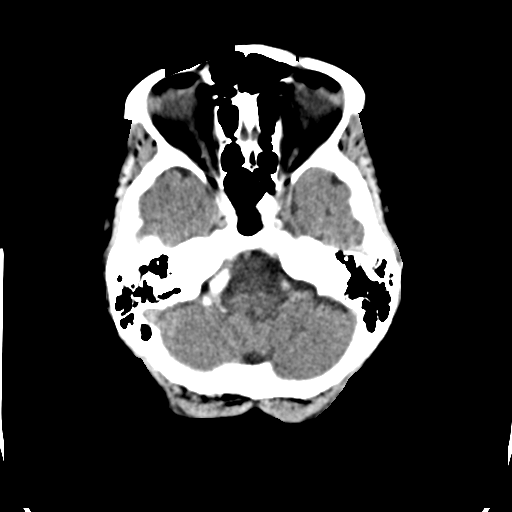
[im 12/32  brain]
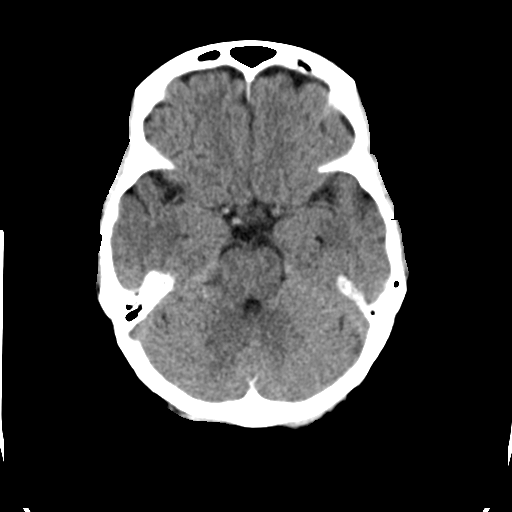
[im 17/32  brain]
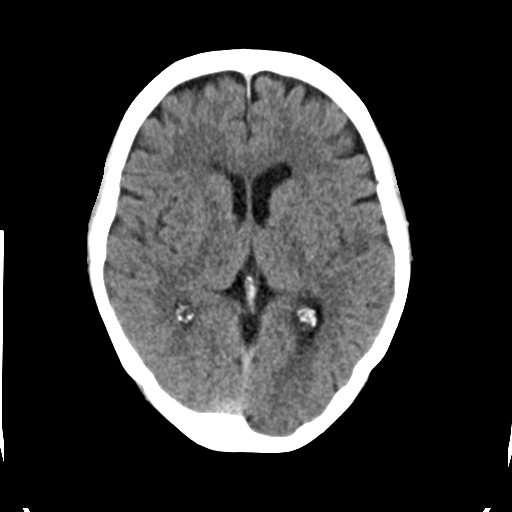
[im 17/32  bone]
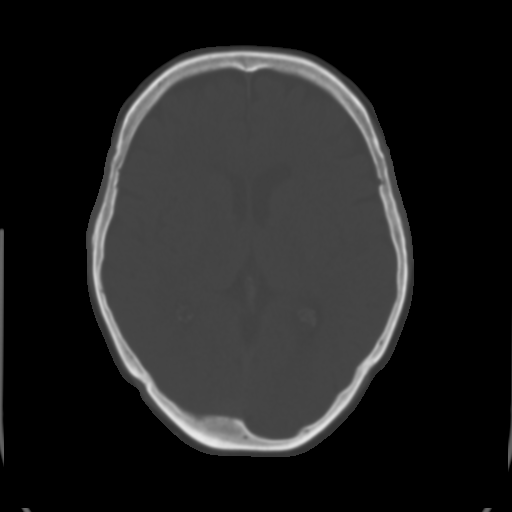
[im 20/32  brain]
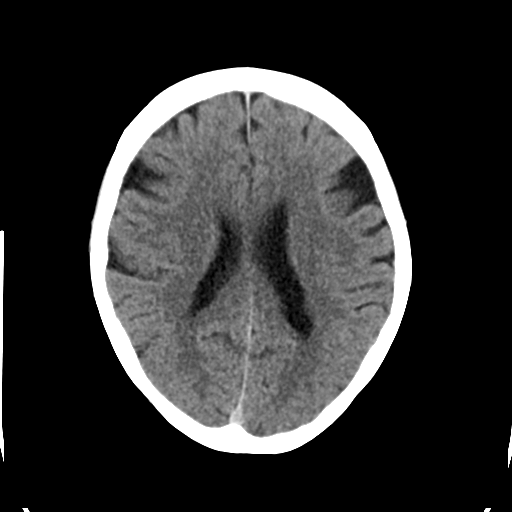
[im 23/32  brain]
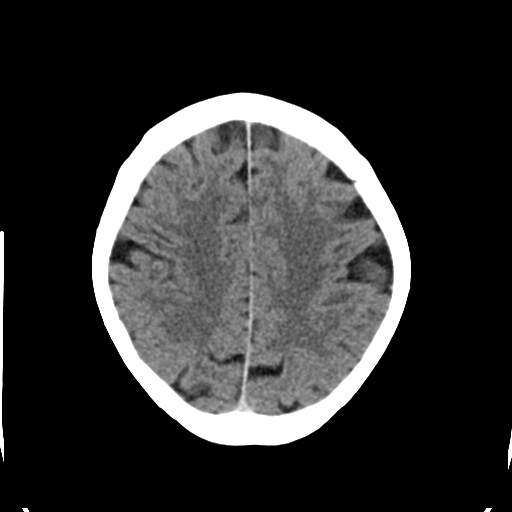
[im 26/32  brain]
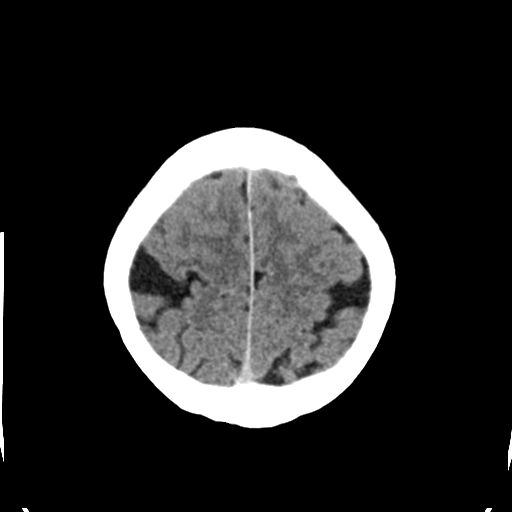
[im 29/32  brain]
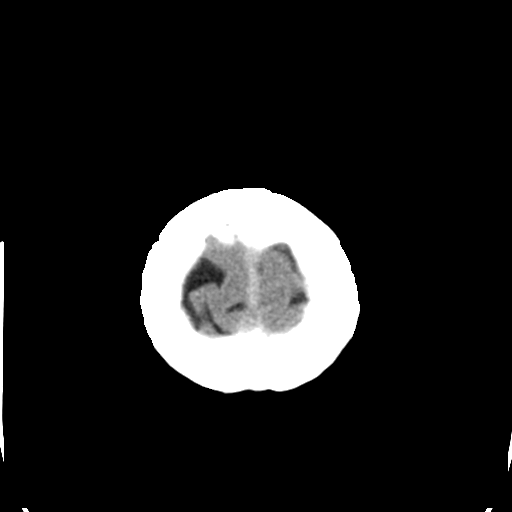
[im 29/32  bone]
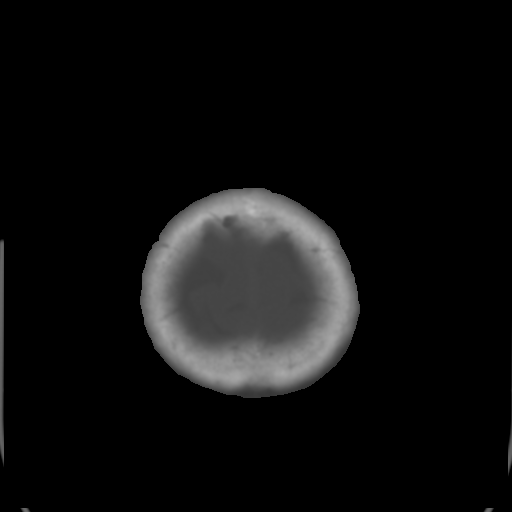

[Series 6: head 3.0 mpr cor · coronal · 0.30mm/px · 3 of 67 slices shown]
[im 23/67  brain]
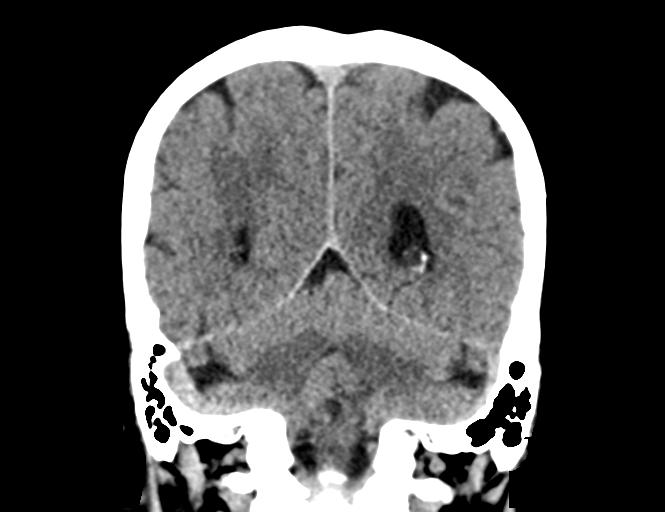
[im 30/67  brain]
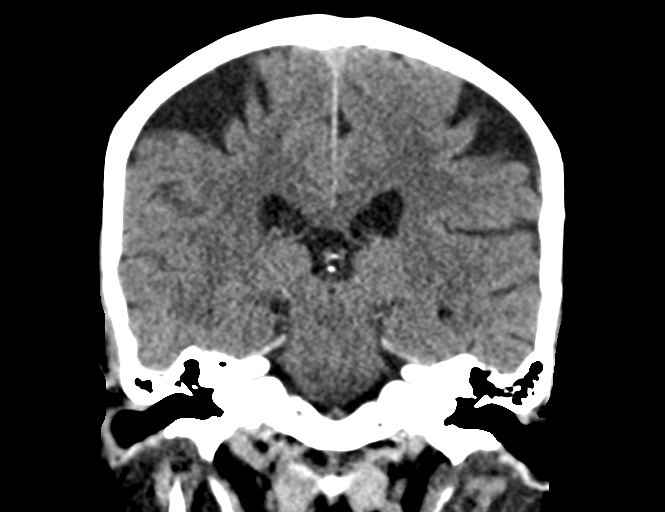
[im 37/67  brain]
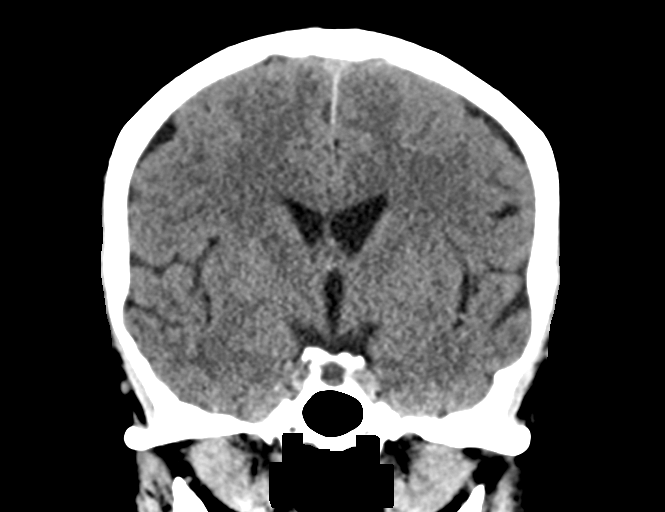

[Series 7: head 3.0 mpr sag · sagittal · 0.33mm/px · 3 of 55 slices shown]
[im 19/55  brain]
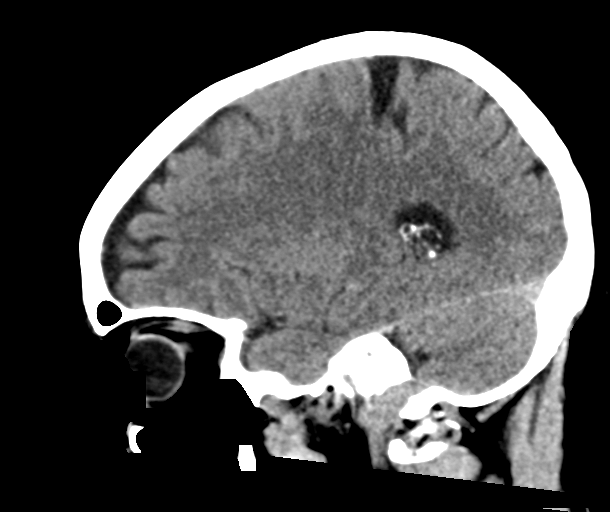
[im 28/55  brain]
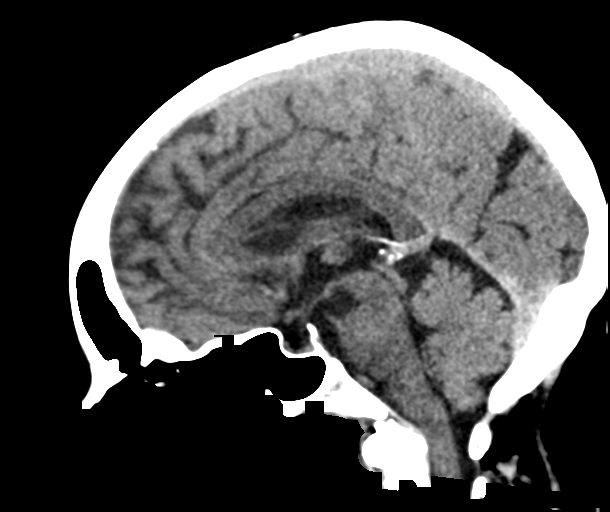
[im 37/55  brain]
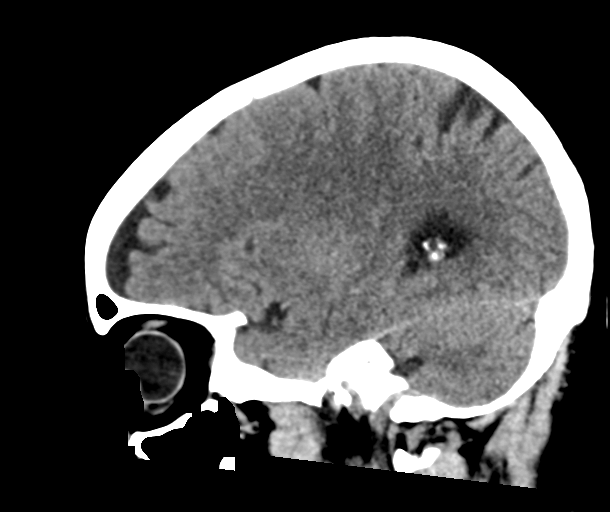

[15 of 47 positions shown; findings below may reference images not displayed]

FINDINGS: Brain: No evidence of acute infarction, hemorrhage, hydrocephalus,
extra-axial collection or mass lesion/mass effect. Mild
periventricular white matter low attenuation as can be seen with
microvascular disease. Mild generalized cerebral atrophy. The

Vascular: No hyperdense vessel. Intracranial atherosclerotic
disease.

Skull: No osseous abnormality.

Sinuses/Orbits: Visualized paranasal sinuses are clear. Visualized
mastoid sinuses are clear. Visualized orbits demonstrate no focal
abnormality.

Other: None
IMPRESSION: No acute intracranial pathology.

## 2018-03-22 ENCOUNTER — Other Ambulatory Visit: Payer: Self-pay

## 2018-03-22 ENCOUNTER — Ambulatory Visit: Payer: Self-pay | Admitting: Internal Medicine

## 2018-03-22 ENCOUNTER — Encounter: Payer: Self-pay | Admitting: Internal Medicine

## 2018-03-22 DIAGNOSIS — I10 Essential (primary) hypertension: Secondary | ICD-10-CM

## 2018-03-22 DIAGNOSIS — K219 Gastro-esophageal reflux disease without esophagitis: Secondary | ICD-10-CM | POA: Insufficient documentation

## 2018-03-22 MED ORDER — OMEPRAZOLE 20 MG PO CPDR: 20.0000 mg | DELAYED_RELEASE_CAPSULE | Freq: Every day | ORAL | 0 refills | Status: DC

## 2018-03-22 MED ORDER — BISOPROLOL-HYDROCHLOROTHIAZIDE 5-6.25 MG PO TABS: 1.0000 | ORAL_TABLET | Freq: Every day | ORAL | 3 refills | Status: DC

## 2018-03-22 MED ORDER — LOSARTAN POTASSIUM 50 MG PO TABS: 50.0000 mg | ORAL_TABLET | Freq: Every day | ORAL | 3 refills | Status: DC

## 2018-03-22 NOTE — Progress Notes (Signed)
Established Patient Office Visit  Subjective:  Patient ID: Alyssa Lyons, female    DOB: January 23, 1956  Age: 62 y.o. MRN: [id number]  CC:  Chief Complaint  Patient presents with  . Annual Exam    HPI Alyssa Lyons presents for f/u of BP. Ran out of meds three days ago. C/o occcasional difficulties when swallowing water but not solids. Also c/o occasional heartburn.  Past Medical History:  Diagnosis Date  . Hepatitis   . Hepatitis C   . Hypertension   . Thyroid disease     No past surgical history on file.  Family History  Problem Relation Age of Onset  . Hypertension Mother   . Heart disease Mother   . Hypertension Father   . Heart disease Father   . Hyperlipidemia Brother   . Hypertension Brother   . Hypertension Brother   . Hyperlipidemia Brother   . Hyperlipidemia Brother   . Hypertension Brother     Social History   Socioeconomic History  . Marital status: Married    Spouse name: Not on file  . Number of children: Not on file  . Years of education: Not on file  . Highest education level: Not on file  Occupational History  . Not on file  Social Needs  . Financial resource strain: Not on file  . Food insecurity:    Worry: Not on file    Inability: Not on file  . Transportation needs:    Medical: Not on file    Non-medical: Not on file  Tobacco Use  . Smoking status: Never Smoker  . Smokeless tobacco: Never Used  Substance and Sexual Activity  . Alcohol use: No  . Drug use: No  . Sexual activity: Not on file  Lifestyle  . Physical activity:    Days per week: Not on file    Minutes per session: Not on file  . Stress: Not on file  Relationships  . Social connections:    Talks on phone: Not on file    Gets together: Not on file    Attends religious service: Not on file    Active member of club or organization: Not on file    Attends meetings of clubs or organizations: Not on file    Relationship status: Not on file  . Intimate partner  violence:    Fear of current or ex partner: Not on file    Emotionally abused: Not on file    Physically abused: Not on file    Forced sexual activity: Not on file  Other Topics Concern  . Not on file  Social History Narrative  . Not on file    Outpatient Medications Prior to Visit  Medication Sig Dispense Refill  . bisoprolol-hydrochlorothiazide (ZIAC) 5-6.25 MG tablet Take 1 tablet by mouth daily. 90 tablet 3  . cephALEXin (KEFLEX) 500 MG capsule Take 1 capsule (500 mg total) by mouth 3 (three) times daily. (Patient not taking: Reported on 11/30/2017) 21 capsule 0  . fluticasone (FLONASE) 50 MCG/ACT nasal spray Place 2 sprays into both nostrils daily. 16 g 6  . fluticasone furoate-vilanterol (BREO ELLIPTA) 100-25 MCG/INH AEPB Inhale 1 puff into the lungs daily. (Patient not taking: Reported on 11/30/2017) 1 each 0  . HYDROcodone-acetaminophen (NORCO/VICODIN) 5-325 MG tablet Take 1 tablet by mouth every 6 (six) hours as needed. (Patient not taking: Reported on 11/30/2017) 8 tablet 0  . levothyroxine (SYNTHROID, LEVOTHROID) 75 MCG tablet Take 1 tablet (75 mcg total) by mouth daily  before breakfast. 90 tablet 1  . losartan (COZAAR) 50 MG tablet Take 1 tablet (50 mg total) by mouth daily. 90 tablet 3  . nystatin ointment (MYCOSTATIN) Apply 1 application topically 2 (two) times daily. (Patient not taking: Reported on 03/22/2017) 30 g 1   No facility-administered medications prior to visit.     No Known Allergies  ROS Review of Systems    Objective:    Physical Exam  BP (!) 156/102   Pulse 76   Temp 98.1 F (36.7 C)   Ht 4\' 11"  (1.499 m)   Wt 150 lb (68 kg)   SpO2 98%   BMI 30.30 kg/m  Wt Readings from Last 3 Encounters:  03/22/18 150 lb (68 kg)  02/15/18 150 lb 9.6 oz (68.3 kg)  01/04/18 150 lb (68 kg)     Health Maintenance Due  Topic Date Due  . Hepatitis C Screening  [birth year]  . HIV Screening  09/17/1970  . TETANUS/TDAP  09/17/1974  . COLONOSCOPY  09/16/2005  .  INFLUENZA VACCINE  11/14/2017    There are no preventive care reminders to display for this patient.  Lab Results  Component Value Date   TSH 3.260 05/01/2016   Lab Results  Component Value Date   WBC 9.8 09/05/2017   HGB 15.2 (H) 09/05/2017   HCT 47.2 (H) 09/05/2017   MCV 87.9 09/05/2017   PLT 194 09/05/2017   Lab Results  Component Value Date   NA 140 09/05/2017   K 4.1 09/05/2017   CO2 28 09/05/2017   GLUCOSE 91 09/05/2017   BUN 6 09/05/2017   CREATININE 0.69 09/05/2017   BILITOT 0.8 09/05/2017   ALKPHOS 87 09/05/2017   AST 42 (H) 09/05/2017   ALT 45 09/05/2017   PROT 7.3 09/05/2017   ALBUMIN 3.6 09/05/2017   CALCIUM 9.6 09/05/2017   ANIONGAP 5 09/05/2017   Lab Results  Component Value Date   CHOL 102 05/11/2016   Lab Results  Component Value Date   HDL 32 (L) 05/11/2016   Lab Results  Component Value Date   LDLCALC 53 05/11/2016   Lab Results  Component Value Date   TRIG 84 05/11/2016   Lab Results  Component Value Date   CHOLHDL 3.2 05/11/2016   Lab Results  Component Value Date   HGBA1C 5.5 05/11/2016      Assessment & Plan:   Problem List Items Addressed This Visit    None      No orders of the defined types were placed in this encounter.   Follow-up: No follow-ups on file.    Luna FuseSheikh Ahmed Tejan-Sie, MD

## 2018-04-19 ENCOUNTER — Ambulatory Visit: Payer: Self-pay | Admitting: Family Medicine

## 2018-04-19 ENCOUNTER — Ambulatory Visit: Payer: Self-pay | Admitting: Internal Medicine

## 2018-04-23 ENCOUNTER — Telehealth: Payer: Self-pay | Admitting: Nutrition

## 2018-04-23 NOTE — Telephone Encounter (Signed)
Please disregard previous message.  Wrong chart

## 2018-04-23 NOTE — Telephone Encounter (Signed)
Patient was shown how to bolus again.  Settings were changed in preset bolus to 4u at breakfast and 4 at lunch and 6 at supper.  She was shown how to increase the 4 to 6 if eating more at breakfast or lunch.  She was also shown how to put the pump in suspend for 1 hour if blood sugars drop low during the night.  She re demonstrated this X2 for me. She was also reminded to restart her Invokanna, and she revebalized this correctly

## 2018-06-23 ENCOUNTER — Other Ambulatory Visit: Payer: Self-pay

## 2019-05-22 ENCOUNTER — Encounter: Payer: Self-pay | Admitting: Internal Medicine

## 2019-05-22 DIAGNOSIS — R52 Pain, unspecified: Secondary | ICD-10-CM | POA: Insufficient documentation

## 2019-05-23 ENCOUNTER — Ambulatory Visit: Payer: Self-pay | Admitting: Internal Medicine

## 2019-05-23 ENCOUNTER — Other Ambulatory Visit: Payer: Self-pay

## 2019-05-23 DIAGNOSIS — I10 Essential (primary) hypertension: Secondary | ICD-10-CM

## 2019-05-23 MED ORDER — LOSARTAN POTASSIUM 50 MG PO TABS: 50.0000 mg | ORAL_TABLET | Freq: Every day | ORAL | 3 refills | Status: AC

## 2019-05-23 MED ORDER — OMEPRAZOLE 20 MG PO CPDR: 20.0000 mg | DELAYED_RELEASE_CAPSULE | Freq: Every day | ORAL | 1 refills | Status: DC

## 2019-05-23 MED ORDER — LEVOTHYROXINE SODIUM 75 MCG PO TABS: 75.0000 ug | ORAL_TABLET | Freq: Every day | ORAL | 1 refills | Status: DC

## 2019-05-23 MED ORDER — BISOPROLOL-HYDROCHLOROTHIAZIDE 5-6.25 MG PO TABS: 1.0000 | ORAL_TABLET | Freq: Two times a day (BID) | ORAL | 1 refills | Status: DC

## 2019-05-23 NOTE — Progress Notes (Signed)
Internal MEDICINE  Office Visit Note  Patient Name: Alyssa Lyons  [id number]  [id number]  Date of Service: 05/23/2019  No chief complaint on file.   HPI  HTN: ziac- am  / losartan at night 50 mg Hypothyroid- need  Synthroid - also lab needed-  C/o sleepying all the time? C/o neck issue-  Some dysphagia issue- GERD- burning sensation at time in throat   Pt is here for routine follow up.    Current Medication: Outpatient Encounter Medications as of 05/23/2019  Medication Sig  . bisoprolol-hydrochlorothiazide (ZIAC) 5-6.25 MG tablet Take 1 tablet by mouth daily.  . cephALEXin (KEFLEX) 500 MG capsule Take 1 capsule (500 mg total) by mouth 3 (three) times daily. (Patient not taking: Reported on 11/30/2017)  . fluticasone (FLONASE) 50 MCG/ACT nasal spray Place 2 sprays into both nostrils daily.  . fluticasone furoate-vilanterol (BREO ELLIPTA) 100-25 MCG/INH AEPB Inhale 1 puff into the lungs daily. (Patient not taking: Reported on 11/30/2017)  . HYDROcodone-acetaminophen (NORCO/VICODIN) 5-325 MG tablet Take 1 tablet by mouth every 6 (six) hours as needed. (Patient not taking: Reported on 11/30/2017)  . levothyroxine (SYNTHROID, LEVOTHROID) 75 MCG tablet Take 1 tablet (75 mcg total) by mouth daily before breakfast.  . losartan (COZAAR) 50 MG tablet Take 1 tablet (50 mg total) by mouth daily.  Marland Kitchen nystatin ointment (MYCOSTATIN) Apply 1 application topically 2 (two) times daily. (Patient not taking: Reported on 03/22/2017)  . omeprazole (PRILOSEC) 20 MG capsule Take 1 capsule (20 mg total) by mouth daily.   No facility-administered encounter medications on file as of 05/23/2019.    Surgical History: No past surgical history on file.  Medical History: Past Medical History:  Diagnosis Date  . Hepatitis   . Hepatitis C   . Hypertension   . Thyroid disease     Family History: Family History  Problem Relation Age of Onset  . Hypertension Mother   . Heart disease Mother   . Hypertension  Father   . Heart disease Father   . Hyperlipidemia Brother   . Hypertension Brother   . Hypertension Brother   . Hyperlipidemia Brother   . Hyperlipidemia Brother   . Hypertension Brother     Social History   Socioeconomic History  . Marital status: Married    Spouse name: Not on file  . Number of children: Not on file  . Years of education: Not on file  . Highest education level: Not on file  Occupational History  . Not on file  Tobacco Use  . Smoking status: Never Smoker  . Smokeless tobacco: Never Used  Substance and Sexual Activity  . Alcohol use: No  . Drug use: No  . Sexual activity: Never  Other Topics Concern  . Not on file  Social History Narrative  . Not on file   Social Determinants of Health   Financial Resource Strain:   . Difficulty of Paying Living Expenses: Not on file  Food Insecurity:   . Worried About Programme researcher, broadcasting/film/video in the Last Year: Not on file  . Ran Out of Food in the Last Year: Not on file  Transportation Needs:   . Lack of Transportation (Medical): Not on file  . Lack of Transportation (Non-Medical): Not on file  Physical Activity:   . Days of Exercise per Week: Not on file  . Minutes of Exercise per Session: Not on file  Stress:   . Feeling of Stress : Not on file  Social Connections:   .  Frequency of Communication with Friends and Family: Not on file  . Frequency of Social Gatherings with Friends and Family: Not on file  . Attends Religious Services: Not on file  . Active Member of Clubs or Organizations: Not on file  . Attends Archivist Meetings: Not on file  . Marital Status: Not on file  Intimate Partner Violence:   . Fear of Current or Ex-Partner: Not on file  . Emotionally Abused: Not on file  . Physically Abused: Not on file  . Sexually Abused: Not on file      Review of Systems  Vital Signs: There were no vitals taken for this visit. BP:  Physical Exam     Assessment/Plan: HTN- mod control-  continue BP med- RF Hypothyroid- CK labs- GERD- RF PPI Need labs-  F/u after labs. Med RF today    General Counseling: Alyssa Lyons verbalizes understanding of the findings of todays visit and agrees with plan of treatment. I have discussed any further diagnostic evaluation that may be needed or ordered today. We also reviewed her medications today. she has been encouraged to call the office with any questions or concerns that should arise related to todays visit.    No orders of the defined types were placed in this encounter.   No orders of the defined types were placed in this encounter.       Wenda Low

## 2019-06-15 ENCOUNTER — Other Ambulatory Visit: Payer: Self-pay | Admitting: Internal Medicine

## 2019-06-16 LAB — COMPREHENSIVE METABOLIC PANEL
ALT: 155 IU/L — ABNORMAL HIGH (ref 0–32)
AST: 134 IU/L — ABNORMAL HIGH (ref 0–40)
Albumin/Globulin Ratio: 1.1 — ABNORMAL LOW (ref 1.2–2.2)
Albumin: 3.6 g/dL — ABNORMAL LOW (ref 3.8–4.8)
Alkaline Phosphatase: 172 IU/L — ABNORMAL HIGH (ref 39–117)
BUN/Creatinine Ratio: 17 (ref 12–28)
BUN: 10 mg/dL (ref 8–27)
Bilirubin Total: 0.5 mg/dL (ref 0.0–1.2)
CO2: 26 mmol/L (ref 20–29)
Calcium: 9.6 mg/dL (ref 8.7–10.3)
Chloride: 105 mmol/L (ref 96–106)
Creatinine, Ser: 0.58 mg/dL (ref 0.57–1.00)
GFR calc Af Amer: 113 mL/min/{1.73_m2} (ref >59)
GFR calc non Af Amer: 98 mL/min/{1.73_m2} (ref >59)
Globulin, Total: 3.2 g/dL (ref 1.5–4.5)
Glucose: 84 mg/dL (ref 65–99)
Potassium: 4.8 mmol/L (ref 3.5–5.2)
Sodium: 140 mmol/L (ref 134–144)
Total Protein: 6.8 g/dL (ref 6.0–8.5)

## 2019-06-16 LAB — CBC
Hematocrit: 42.8 % (ref 34.0–46.6)
Hemoglobin: 14.7 g/dL (ref 11.1–15.9)
MCH: 30.9 pg (ref 26.6–33.0)
MCHC: 34.3 g/dL (ref 31.5–35.7)
MCV: 90 fL (ref 79–97)
Platelets: 118 10*3/uL — ABNORMAL LOW (ref 150–450)
RBC: 4.75 x10E6/uL (ref 3.77–5.28)
RDW: 11.3 % — ABNORMAL LOW (ref 11.7–15.4)
WBC: 6 10*3/uL (ref 3.4–10.8)

## 2019-06-16 LAB — TSH: TSH: 1.7 u[IU]/mL (ref 0.450–4.500)

## 2019-06-16 LAB — LIPID PANEL W/O CHOL/HDL RATIO
Cholesterol, Total: 126 mg/dL (ref 100–199)
HDL: 40 mg/dL (ref >39)
LDL Chol Calc (NIH): 72 mg/dL (ref 0–99)
Triglycerides: 68 mg/dL (ref 0–149)
VLDL Cholesterol Cal: 14 mg/dL (ref 5–40)

## 2019-06-19 ENCOUNTER — Other Ambulatory Visit: Payer: Self-pay

## 2019-06-20 ENCOUNTER — Ambulatory Visit: Payer: Self-pay | Admitting: Internal Medicine

## 2019-06-20 ENCOUNTER — Other Ambulatory Visit: Payer: Self-pay

## 2019-06-20 DIAGNOSIS — B182 Chronic viral hepatitis C: Secondary | ICD-10-CM

## 2019-06-20 DIAGNOSIS — D691 Qualitative platelet defects: Secondary | ICD-10-CM

## 2019-06-20 DIAGNOSIS — R0602 Shortness of breath: Secondary | ICD-10-CM

## 2019-06-20 NOTE — Progress Notes (Signed)
PATIENT NAME: Alyssa Lyons    MR#:  [id number]  DATE OF BIRTH:  Aug 29, 1955  DATE OF ADMISSION:  (Not on file)  PRIMARY CARE PHYSICIAN: Patient, No Pcp Per   REQUESTING/REFERRING PHYSICIAN:   CHIEF COMPLAINT:  Back Pain, Heart Burn, Shortness of breath and exhaustion when walking, cant walk more than a block without feeling shortness of breath, complains of claudication of both legs, takes medication for hypothyroidism, and for hypertension  HISTORY OF PRESENT ILLNESS:  Alyssa Lyons  is a 64 y.o. female with a known history of hepatitis, taking medicine with no follow-up   PAST MEDICAL HISTORY:   Past Medical History:  Diagnosis Date  . Hepatitis   . Hepatitis C   . Hypertension   . Thyroid disease     PAST SURGICAL HISTORY:  No past surgical history on file.  SOCIAL HISTORY:   Social History   Tobacco Use  . Smoking status: Never Smoker  . Smokeless tobacco: Never Used  Substance Use Topics  . Alcohol use: No    FAMILY HISTORY:   Family History  Problem Relation Age of Onset  . Hypertension Mother   . Heart disease Mother   . Hypertension Father   . Heart disease Father   . Hyperlipidemia Brother   . Hypertension Brother   . Hypertension Brother   . Hyperlipidemia Brother   . Hyperlipidemia Brother   . Hypertension Brother     DRUG ALLERGIES:  No Known Allergies  REVIEW OF SYSTEMS:   ROS complains of shortness of breath, and leg pain when walking, tired and fatigued  MEDICATIONS AT HOME:   Prior to Admission medications   Medication Sig Start Date End Date Taking? Authorizing Provider  levothyroxine (SYNTHROID) 75 MCG tablet Take 1 tablet (75 mcg total) by mouth daily before breakfast. 05/23/19   Wenda Low, MD  losartan (COZAAR) 50 MG tablet Take 1 tablet (50 mg total) by mouth daily. 05/23/19   Wenda Low, MD  omeprazole (PRILOSEC) 20 MG capsule Take 1 capsule (20 mg total) by mouth daily. 05/23/19 08/21/19  Wenda Low, MD       VITAL SIGNS:  There were no vitals taken for this visit.  PHYSICAL EXAMINATION:  Physical Exam  Virtual visit  LABORATORY PANEL:   CBC Recent Labs  Lab 06/15/19 1134  WBC 6.0  HGB 14.7  HCT 42.8  PLT 118*   ------------------------------------------------------------------------------------------------------------------  Chemistries  Recent Labs  Lab 06/15/19 1134  NA 140  K 4.8  CL 105  CO2 26  GLUCOSE 84  BUN 10  CREATININE 0.58  CALCIUM 9.6  AST 134*  ALT 155*  ALKPHOS 172*  BILITOT 0.5   ------------------------------------------------------------------------------------------------------------------    IMPRESSION AND PLAN:   Patient seems to have the following problems: Back pain, shortness of breath and exhaustion, heart burn with exhaustion and without, she also complains of symptoms of claudication, Her hepatitis and blood pressure need to be assessed, also complains of increased frequency of micturition.  Suggested that she needs to be seen by liver doctors and hematologist for abnormal liver test and low platelet count Blood pressure needs to be checked Needs new labs for Urina, CBC  diff, Metabolic panel,  Make appointment for her to be seen by GI and Hematologist  And chest examined, needs to be seen by cardiologist in person to assess shortness of breath and claudication      Management plans discussed with the patient, family and they are in agreement.  TOTAL TIME TAKING CARE OF THIS PATIENT: 35 minutes.    Corky Downs M.D on 06/20/2019 at 10:39 AM

## 2019-07-30 ENCOUNTER — Other Ambulatory Visit: Payer: Self-pay | Admitting: Internal Medicine

## 2019-07-31 LAB — COMPREHENSIVE METABOLIC PANEL
ALT: 115 IU/L — ABNORMAL HIGH (ref 0–32)
AST: 107 IU/L — ABNORMAL HIGH (ref 0–40)
Albumin/Globulin Ratio: 1.2 (ref 1.2–2.2)
Albumin: 3.7 g/dL — ABNORMAL LOW (ref 3.8–4.8)
Alkaline Phosphatase: 163 IU/L — ABNORMAL HIGH (ref 39–117)
BUN/Creatinine Ratio: 12 (ref 12–28)
BUN: 8 mg/dL (ref 8–27)
Bilirubin Total: 0.5 mg/dL (ref 0.0–1.2)
CO2: 25 mmol/L (ref 20–29)
Calcium: 9.5 mg/dL (ref 8.7–10.3)
Chloride: 106 mmol/L (ref 96–106)
Creatinine, Ser: 0.68 mg/dL (ref 0.57–1.00)
GFR calc Af Amer: 108 mL/min/{1.73_m2} (ref >59)
GFR calc non Af Amer: 93 mL/min/{1.73_m2} (ref >59)
Globulin, Total: 3 g/dL (ref 1.5–4.5)
Glucose: 92 mg/dL (ref 65–99)
Potassium: 4.3 mmol/L (ref 3.5–5.2)
Sodium: 140 mmol/L (ref 134–144)
Total Protein: 6.7 g/dL (ref 6.0–8.5)

## 2019-07-31 LAB — URINALYSIS, ROUTINE W REFLEX MICROSCOPIC
Bilirubin, UA: NEGATIVE
Glucose, UA: NEGATIVE
Ketones, UA: NEGATIVE
Nitrite, UA: NEGATIVE
Protein,UA: NEGATIVE
RBC, UA: NEGATIVE
Specific Gravity, UA: 1.014 (ref 1.005–1.030)
Urobilinogen, Ur: 0.2 mg/dL (ref 0.2–1.0)
pH, UA: 7 (ref 5.0–7.5)

## 2019-07-31 LAB — MICROSCOPIC EXAMINATION
Bacteria, UA: NONE SEEN
Casts: NONE SEEN /lpf

## 2019-07-31 LAB — LIPID PANEL W/O CHOL/HDL RATIO
Cholesterol, Total: 113 mg/dL (ref 100–199)
HDL: 37 mg/dL — ABNORMAL LOW (ref >39)
LDL Chol Calc (NIH): 61 mg/dL (ref 0–99)
Triglycerides: 70 mg/dL (ref 0–149)
VLDL Cholesterol Cal: 15 mg/dL (ref 5–40)

## 2019-07-31 LAB — TSH: TSH: 0.265 u[IU]/mL — ABNORMAL LOW (ref 0.450–4.500)

## 2019-07-31 LAB — HGB A1C W/O EAG: Hgb A1c MFr Bld: 5.1 % (ref 4.8–5.6)

## 2019-08-01 ENCOUNTER — Ambulatory Visit: Payer: Self-pay | Admitting: Internal Medicine

## 2019-08-02 ENCOUNTER — Encounter (HOSPITAL_COMMUNITY): Payer: Self-pay | Admitting: Emergency Medicine

## 2019-08-02 ENCOUNTER — Emergency Department (HOSPITAL_COMMUNITY)
Admission: EM | Admit: 2019-08-02 | Discharge: 2019-08-03 | Disposition: A | Discharge status: Home / Self Care | Payer: 59 | Attending: Emergency Medicine | Admitting: Emergency Medicine

## 2019-08-02 ENCOUNTER — Other Ambulatory Visit: Payer: Self-pay

## 2019-08-02 DIAGNOSIS — R7989 Other specified abnormal findings of blood chemistry: Secondary | ICD-10-CM | POA: Diagnosis not present

## 2019-08-02 DIAGNOSIS — R531 Weakness: Secondary | ICD-10-CM | POA: Insufficient documentation

## 2019-08-02 DIAGNOSIS — E039 Hypothyroidism, unspecified: Secondary | ICD-10-CM | POA: Diagnosis not present

## 2019-08-02 DIAGNOSIS — I10 Essential (primary) hypertension: Secondary | ICD-10-CM | POA: Diagnosis not present

## 2019-08-02 DIAGNOSIS — G43009 Migraine without aura, not intractable, without status migrainosus: Secondary | ICD-10-CM | POA: Diagnosis not present

## 2019-08-02 DIAGNOSIS — D696 Thrombocytopenia, unspecified: Secondary | ICD-10-CM | POA: Diagnosis not present

## 2019-08-02 DIAGNOSIS — R519 Headache, unspecified: Secondary | ICD-10-CM | POA: Diagnosis present

## 2019-08-02 DIAGNOSIS — Z79899 Other long term (current) drug therapy: Secondary | ICD-10-CM | POA: Insufficient documentation

## 2019-08-02 LAB — CBC
HCT: 44.5 % (ref 36.0–46.0)
Hemoglobin: 14.4 g/dL (ref 12.0–15.0)
MCH: 30.2 pg (ref 26.0–34.0)
MCHC: 32.4 g/dL (ref 30.0–36.0)
MCV: 93.3 fL (ref 80.0–100.0)
Platelets: 129 10*3/uL — ABNORMAL LOW (ref 150–400)
RBC: 4.77 MIL/uL (ref 3.87–5.11)
RDW: 11.9 % (ref 11.5–15.5)
WBC: 6 10*3/uL (ref 4.0–10.5)
nRBC: 0 % (ref 0.0–0.2)

## 2019-08-02 LAB — BASIC METABOLIC PANEL
Anion gap: 8 (ref 5–15)
BUN: 7 mg/dL — ABNORMAL LOW (ref 8–23)
CO2: 25 mmol/L (ref 22–32)
Calcium: 9.6 mg/dL (ref 8.9–10.3)
Chloride: 105 mmol/L (ref 98–111)
Creatinine, Ser: 0.56 mg/dL (ref 0.44–1.00)
GFR calc Af Amer: >60 mL/min (ref >60)
GFR calc non Af Amer: >60 mL/min (ref >60)
Glucose, Bld: 91 mg/dL (ref 70–99)
Potassium: 4.3 mmol/L (ref 3.5–5.1)
Sodium: 138 mmol/L (ref 135–145)

## 2019-08-02 MED ORDER — SODIUM CHLORIDE 0.9% FLUSH: 3.0000 mL | Freq: Once | INTRAVENOUS | Status: DC

## 2019-08-02 NOTE — ED Triage Notes (Signed)
C/o headache x 1 month with generalized weakness.  Family reports pt has been unable to read lately because she says her head is "too tired".  Reports it feels like head is in ice today.  No arm drift.

## 2019-08-03 LAB — HEPATIC FUNCTION PANEL
ALT: 100 U/L — ABNORMAL HIGH (ref 0–44)
AST: 89 U/L — ABNORMAL HIGH (ref 15–41)
Albumin: 3.2 g/dL — ABNORMAL LOW (ref 3.5–5.0)
Alkaline Phosphatase: 162 U/L — ABNORMAL HIGH (ref 38–126)
Bilirubin, Direct: <0.1 mg/dL (ref 0.0–0.2)
Total Bilirubin: 0.6 mg/dL (ref 0.3–1.2)
Total Protein: 6.8 g/dL (ref 6.5–8.1)

## 2019-08-03 MED ORDER — DIPHENHYDRAMINE HCL 50 MG/ML IJ SOLN
25.0000 mg | Freq: Once | INTRAMUSCULAR | Status: AC
  Administered 2019-08-03: 01:00:00 25 mg via INTRAVENOUS
  Filled 2019-08-03: qty 1

## 2019-08-03 MED ORDER — SODIUM CHLORIDE 0.9 % IV BOLUS
1000.0000 mL | Freq: Once | INTRAVENOUS | Status: AC
  Administered 2019-08-03: 1000 mL via INTRAVENOUS

## 2019-08-03 MED ORDER — PROCHLORPERAZINE EDISYLATE 10 MG/2ML IJ SOLN
10.0000 mg | Freq: Once | INTRAMUSCULAR | Status: AC
  Administered 2019-08-03: 01:00:00 10 mg via INTRAVENOUS
  Filled 2019-08-03: qty 2

## 2019-08-03 MED ORDER — DEXAMETHASONE SODIUM PHOSPHATE 10 MG/ML IJ SOLN
10.0000 mg | Freq: Once | INTRAMUSCULAR | Status: AC
  Administered 2019-08-03: 04:00:00 10 mg via INTRAVENOUS
  Filled 2019-08-03: qty 1

## 2019-08-03 NOTE — ED Provider Notes (Signed)
[facility] EMERGENCY DEPARTMENT Provider Note   CSN: [id number] Arrival date & time: 08/02/19  1750   History Chief Complaint  Patient presents with  . Headache  . Weakness    Alyssa Lyons is a 64 y.o. female.  The history is provided by the patient and a relative. A language interpreter was used.  Headache Associated symptoms: weakness   Weakness Associated symptoms: headaches   She has been having headaches for the last 2 weeks which are typical of her migraines.  There is associated photophobia and occasional nausea.  She denies fever or chills.  She is also complaining of being generally weak.  She does have a history of hepatitis C and is being evaluated by her PCP to see if she qualifies for treatment.  Of note, she is fasting during the daytime and headache seems to be worse when she is fasting.  She has not taken anything to treat her headaches.  Although she is complaining of being generally weak, she is not complaining of any focal weakness.  Past Medical History:  Diagnosis Date  . Hepatitis   . Hepatitis C   . Hypertension   . Thyroid disease     Patient Active Problem List   Diagnosis Date Noted  . Body aches 05/22/2019  . GERD (gastroesophageal reflux disease) 03/22/2018  . Cough 05/18/2016  . Hemoptysis 05/18/2016  . Exposure to smoke from wood stove 05/18/2016  . Dizziness 05/17/2016  . Chest pain 05/15/2016  . Dyspnea and respiratory abnormality 05/15/2016  . Near syncope 05/15/2016  . Travel advice encounter 05/01/2016  . Hypothyroidism 05/01/2016    History reviewed. No pertinent surgical history.   OB History   No obstetric history on file.     Family History  Problem Relation Age of Onset  . Hypertension Mother   . Heart disease Mother   . Hypertension Father   . Heart disease Father   . Hyperlipidemia Brother   . Hypertension Brother   . Hypertension Brother   . Hyperlipidemia Brother   . Hyperlipidemia  Brother   . Hypertension Brother     Social History   Tobacco Use  . Smoking status: Never Smoker  . Smokeless tobacco: Never Used  Substance Use Topics  . Alcohol use: No  . Drug use: No    Home Medications Prior to Admission medications   Medication Sig Start Date End Date Taking? Authorizing Provider  levothyroxine (SYNTHROID) 75 MCG tablet Take 1 tablet (75 mcg total) by mouth daily before breakfast. 05/23/19   Wenda Low, MD  losartan (COZAAR) 50 MG tablet Take 1 tablet (50 mg total) by mouth daily. 05/23/19   Wenda Low, MD  omeprazole (PRILOSEC) 20 MG capsule Take 1 capsule (20 mg total) by mouth daily. 05/23/19 08/21/19  Wenda Low, MD    Allergies    Patient has no known allergies.  Review of Systems   Review of Systems  Neurological: Positive for weakness and headaches.  All other systems reviewed and are negative.   Physical Exam Updated Vital Signs BP (!) 179/90 (BP Location: Left Arm)   Pulse 79   Temp 98.4 F (36.9 C) (Oral)   Resp 16   SpO2 96%   Physical Exam Vitals and nursing note reviewed.   64 year old female, resting comfortably and in no acute distress. Vital signs are normal. Oxygen saturation is 97%, which is normal. Head is normocephalic and atraumatic. PERRLA, EOMI. Oropharynx is clear. Neck is nontender and  supple without adenopathy or JVD. Back is nontender and there is no CVA tenderness. Lungs are clear without rales, wheezes, or rhonchi. Chest is nontender. Heart has regular rate and rhythm without murmur. Abdomen is soft, flat, nontender without masses or hepatosplenomegaly and peristalsis is normoactive. Extremities have no cyanosis or edema, full range of motion is present. Skin is warm and dry without rash. Neurologic: Mental status is normal, cranial nerves are intact, there are no motor or sensory deficits.  ED Results / Procedures / Treatments   Labs (all labs ordered are listed, but only abnormal results are displayed)  Labs Reviewed  BASIC METABOLIC PANEL - Abnormal; Notable for the following components:      Result Value   BUN 7 (*)    All other components within normal limits  CBC - Abnormal; Notable for the following components:   Platelets 129 (*)    All other components within normal limits  HEPATIC FUNCTION PANEL - Abnormal; Notable for the following components:   Albumin 3.2 (*)    AST 89 (*)    ALT 100 (*)    Alkaline Phosphatase 162 (*)    All other components within normal limits  URINALYSIS, ROUTINE W REFLEX MICROSCOPIC    EKG EKG Interpretation  Date/Time:  Sunday August 02 2019 18:29:08 EDT Ventricular Rate:  74 PR Interval:  166 QRS Duration: 62 QT Interval:  384 QTC Calculation: 426 R Axis:   -27 Text Interpretation: Normal sinus rhythm Low voltage QRS Inferior infarct , age undetermined Cannot rule out Anteroseptal infarct , age undetermined Abnormal ECG When compared with ECG of 09/05/2017, No significant change was found Confirmed by Dione Booze ([id number]) on 08/02/2019 11:29:27 PM  Procedures Procedures  Medications Ordered in ED Medications  sodium chloride flush (NS) 0.9 % injection 3 mL (3 mLs Intravenous Not Given 08/03/19 0101)  dexamethasone (DECADRON) injection 10 mg (has no administration in time range)  sodium chloride 0.9 % bolus 1,000 mL (0 mLs Intravenous Stopped 08/03/19 0242)  prochlorperazine (COMPAZINE) injection 10 mg (10 mg Intravenous Given 08/03/19 0116)  diphenhydrAMINE (BENADRYL) injection 25 mg (25 mg Intravenous Given 08/03/19 0115)    ED Course  I have reviewed the triage vital signs and the nursing notes.  Pertinent labs & imaging results that were available during my care of the patient were reviewed by me and considered in my medical decision making (see chart for details).  MDM Rules/Calculators/A&P Apparent migraine headaches, probably of these partially secondary to fasting.  Generalized weakness, possibly secondary to headache.  Will need to  check hepatic functions.  CBC is significant only for mild thrombocytopenia which is not significantly changed from recent value.  Basic metabolic panel is normal.  Old records are reviewed confirming outpatient evaluation of hepatitis C. She will be given a a headache cocktail of normal saline, prochlorperazine, diphenhydramine and reassessed.  Hepatic function studies show elevated transaminases and alkaline phosphatase, slightly decreased from results from 1 month ago.  She had excellent relief of her headache with above-noted treatment.  She is discharged and instructed to make sure she maintains her hydration status.  Return precautions discussed.  Final Clinical Impression(s) / ED Diagnoses Final diagnoses:  Migraine without aura and without status migrainosus, not intractable  Elevated liver function tests  Thrombocytopenia [facility])    Rx / DC Orders ED Discharge Orders    None       Dione Booze, MD 08/03/19 [phone]

## 2019-08-03 NOTE — ED Notes (Signed)
Patient verbalizes understanding of discharge instructions. Opportunity for questioning and answers were provided. Armband removed by staff, pt discharged from ED.  

## 2019-08-03 NOTE — Discharge Instructions (Addendum)
Make sure to keep yourself well hydrated.  Return if you are having any problems.

## 2019-08-03 NOTE — ED Notes (Signed)
Hepatic panel added to blood in main lab.

## 2019-08-15 ENCOUNTER — Ambulatory Visit: Payer: Self-pay | Admitting: Internal Medicine

## 2019-08-15 ENCOUNTER — Encounter: Payer: Self-pay | Admitting: Internal Medicine

## 2019-08-15 ENCOUNTER — Other Ambulatory Visit: Payer: Self-pay

## 2019-08-15 DIAGNOSIS — E039 Hypothyroidism, unspecified: Secondary | ICD-10-CM

## 2019-08-15 DIAGNOSIS — I10 Essential (primary) hypertension: Secondary | ICD-10-CM | POA: Insufficient documentation

## 2019-08-15 DIAGNOSIS — K219 Gastro-esophageal reflux disease without esophagitis: Secondary | ICD-10-CM

## 2019-08-15 DIAGNOSIS — B182 Chronic viral hepatitis C: Secondary | ICD-10-CM

## 2019-08-15 MED ORDER — LEVOTHYROXINE SODIUM 75 MCG PO TABS: 75.0000 ug | ORAL_TABLET | Freq: Every day | ORAL | 1 refills | Status: AC

## 2019-08-15 NOTE — Progress Notes (Signed)
Internal MEDICINE  Office Visit Note  Patient Name: Alyssa Lyons  [id number]  [id number]  Date of Service: 08/15/2019  No chief complaint on file.   HPI  F/u on  1. HTN: on med- BP at homereading:  - not checking Labs 08/05/19- normal 2. hypohyroid- on meds-  TSH .265-     3. GERD on PPI-  C/o diarrhrea-  Some   Pt is here for routine follow up.    Current Medication: Outpatient Encounter Medications as of 08/15/2019  Medication Sig  . levothyroxine (SYNTHROID) 75 MCG tablet Take 1 tablet (75 mcg total) by mouth daily before breakfast.  . losartan (COZAAR) 50 MG tablet Take 1 tablet (50 mg total) by mouth daily.  Marland Kitchen omeprazole (PRILOSEC) 20 MG capsule Take 1 capsule (20 mg total) by mouth daily.   No facility-administered encounter medications on file as of 08/15/2019.    Surgical History: No past surgical history on file.  Medical History: Past Medical History:  Diagnosis Date  . Hepatitis   . Hepatitis C   . Hypertension   . Thyroid disease     Family History: Family History  Problem Relation Age of Onset  . Hypertension Mother   . Heart disease Mother   . Hypertension Father   . Heart disease Father   . Hyperlipidemia Brother   . Hypertension Brother   . Hypertension Brother   . Hyperlipidemia Brother   . Hyperlipidemia Brother   . Hypertension Brother     Social History   Socioeconomic History  . Marital status: Married    Spouse name: Not on file  . Number of children: Not on file  . Years of education: Not on file  . Highest education level: Not on file  Occupational History  . Not on file  Tobacco Use  . Smoking status: Never Smoker  . Smokeless tobacco: Never Used  Substance and Sexual Activity  . Alcohol use: No  . Drug use: No  . Sexual activity: Never  Other Topics Concern  . Not on file  Social History Narrative  . Not on file   Social Determinants of Health   Financial Resource Strain:   . Difficulty of Paying Living  Expenses:   Food Insecurity:   . Worried About Programme researcher, broadcasting/film/video in the Last Year:   . Barista in the Last Year:   Transportation Needs:   . Freight forwarder (Medical):   Marland Kitchen Lack of Transportation (Non-Medical):   Physical Activity:   . Days of Exercise per Week:   . Minutes of Exercise per Session:   Stress:   . Feeling of Stress :   Social Connections:   . Frequency of Communication with Friends and Family:   . Frequency of Social Gatherings with Friends and Family:   . Attends Religious Services:   . Active Member of Clubs or Organizations:   . Attends Banker Meetings:   Marland Kitchen Marital Status:   Intimate Partner Violence:   . Fear of Current or Ex-Partner:   . Emotionally Abused:   Marland Kitchen Physically Abused:   . Sexually Abused:       Review of Systems  Vital Signs: There were no vitals taken for this visit.   Physical Exam  Not done     Assessment/Plan: HTN- encourge to ck BP at home- reading- ? High Continue Losartan Hypothyroid- TSH mild low- - euthyroid- keep same dose- RF med Recheck TSH in 2 month GERD-  stay on PPI C/o Dry mouth- lab- glucose and urine ok- Other lab work ok- discussed Elevated LFT- due to Hep C dx- clinically asymptomatic F/u in 2 months   General Counseling: Caelen verbalizes understanding of the findings of todays visit and agrees with plan of treatment. I have discussed any further diagnostic evaluation that may be needed or ordered today. We also reviewed her medications today. she has been encouraged to call the office with any questions or concerns that should arise related to todays visit.    No orders of the defined types were placed in this encounter.   No orders of the defined types were placed in this encounter.       Wenda Low

## 2019-08-27 ENCOUNTER — Other Ambulatory Visit: Payer: Self-pay | Admitting: Internal Medicine

## 2019-08-27 DIAGNOSIS — I10 Essential (primary) hypertension: Secondary | ICD-10-CM

## 2019-08-27 DIAGNOSIS — R7989 Other specified abnormal findings of blood chemistry: Secondary | ICD-10-CM

## 2019-08-29 ENCOUNTER — Ambulatory Visit: Payer: Self-pay | Admitting: Cardiovascular Disease

## 2019-09-09 ENCOUNTER — Ambulatory Visit (INDEPENDENT_AMBULATORY_CARE_PROVIDER_SITE_OTHER): Payer: 59 | Admitting: Family

## 2019-09-09 ENCOUNTER — Other Ambulatory Visit: Payer: Self-pay

## 2019-09-09 ENCOUNTER — Telehealth: Payer: Self-pay | Admitting: Pharmacy Technician

## 2019-09-09 ENCOUNTER — Encounter: Payer: Self-pay | Admitting: Family

## 2019-09-09 VITALS — BP 159/95 | HR 71 | Wt 145.0 lb

## 2019-09-09 DIAGNOSIS — R768 Other specified abnormal immunological findings in serum: Secondary | ICD-10-CM | POA: Insufficient documentation

## 2019-09-09 NOTE — Assessment & Plan Note (Signed)
Ms. Leu is a 63 y/o female native of [place] with positive Hepatitis C antibody test with previous treatment for Hepatitis C in [place]. Does have nausea likely associated with GERD and has no current RUQ pains. Discussed the pathogenesis, risks if left untreated, resistance, and treatment options for Hepatitis C. Check blood work today including HCV RNA level, genotype, NS5a resistance, HIV status, and liver function tests. Met with pharmacy staff to complete medication assistance paperwork. Plan for follow up and treatment recommendations, if needed, pending blood work results.

## 2019-09-09 NOTE — Telephone Encounter (Signed)
[facility] Patient Product/process development scientist completed.    The patient is uninsured (has insurance that covers maintenance medications, not specialty) and will need patient assistance for medication (we should get patient assistance forms signed, then send prior authorization to insurance.  If denied, appeal to patient assistance with the knowledge that plan will not cover Hepatitis C medications).  We can complete the application and will need to meet with the patient for signatures and income documentation.  Netty Starring. Dimas Aguas CPhT Specialty Pharmacy Patient [facility] for Infectious Disease Phone: [phone] Fax:  [phone]

## 2019-09-09 NOTE — Telephone Encounter (Signed)
[facility] Patient Advocate Encounter   Patient has [employer].  It is to be determined if she needs a Hepatitis C medication.  If she does, we can submit a prior authorization to [facility].  If they deny, we have patient assistance forms signed by the patient that we can submit.    [facility] Clinic will continue to follow.   Netty Starring. Dimas Aguas CPhT Specialty Pharmacy Patient [facility] for Infectious Disease Phone: [phone] Fax:  [phone]

## 2019-09-09 NOTE — Progress Notes (Signed)
Subjective:    Patient ID: Alyssa Lyons, female    DOB: October 27, 1955, 64 y.o.   MRN: [id number]  Chief Complaint  Patient presents with  . New Patient (Initial Visit)    with daughter in law to translate    HPI:  Alyssa Lyons is a 64 y.o. female with previous medical history of hypothyroidism, hypertension, and gastroesophageal reflux presenting today at the request of her primary care provider for initial evaluation and treatment of hepatitis C. Alyssa Lyons's primary preferred language is Urdu [place] and her daughter-in-law is present at her request to translate. A medical translator was offered.   Alyssa Lyons was initially diagnosed about 8-10 years ago when living in her native [place]. [place]. Risk factors for acquiring Hepatitis C are age. Denies history of IVDU, sharing of razors/toothbrushes, blood tranfusions, or sexual contact with known positive partner. Previously treated sometime between 2012-14 with oral medication that was taken twice daily. Currently with nausea at times and feelings of a "heavy chest." No scleral icterus, RUQ pain, or jaundice. No personal or family history of liver disease. No current recreational or illicit drug use, tobacco use or alcohol consumption.   Blood work reviewed from primary care with positive Hepatitis C antibody test, elevated liver enzymes with AST 88 and ALT of 92, platlet count of 138 and negative Hepatitis B surface antigen.   No Known Allergies    Outpatient Medications Prior to Visit  Medication Sig Dispense Refill  . levothyroxine (SYNTHROID) 75 MCG tablet Take 1 tablet (75 mcg total) by mouth daily before breakfast. 90 tablet 1  . losartan (COZAAR) 50 MG tablet Take 1 tablet (50 mg total) by mouth daily. 90 tablet 3  . omeprazole (PRILOSEC) 20 MG capsule Take 1 capsule (20 mg total) by mouth daily. 90 capsule 1   No facility-administered medications prior to visit.     Past Medical  History:  Diagnosis Date  . Hepatitis   . Hepatitis C   . Hypertension   . Thyroid disease       History reviewed. No pertinent surgical history.    Family History  Problem Relation Age of Onset  . Hypertension Mother   . Heart disease Mother   . Hypertension Father   . Heart disease Father   . Hyperlipidemia Brother   . Hypertension Brother   . Hypertension Brother   . Hyperlipidemia Brother   . Hyperlipidemia Brother   . Hypertension Brother       Social History   Socioeconomic History  . Marital status: Married    Spouse name: Not on file  . Number of children: Not on file  . Years of education: Not on file  . Highest education level: Not on file  Occupational History  . Not on file  Tobacco Use  . Smoking status: Never Smoker  . Smokeless tobacco: Never Used  Substance and Sexual Activity  . Alcohol use: No  . Drug use: No  . Sexual activity: Never  Other Topics Concern  . Not on file  Social History Narrative  . Not on file   Social Determinants of Health   Financial Resource Strain:   . Difficulty of Paying Living Expenses:   Food Insecurity:   . Worried About Charity fundraiser in the Last Year:   . Arboriculturist in the Last Year:   Transportation Needs:   . Film/video editor (Medical):   Marland Kitchen Lack of Transportation (  Non-Medical):   Physical Activity:   . Days of Exercise per Week:   . Minutes of Exercise per Session:   Stress:   . Feeling of Stress :   Social Connections:   . Frequency of Communication with Friends and Family:   . Frequency of Social Gatherings with Friends and Family:   . Attends Religious Services:   . Active Member of Clubs or Organizations:   . Attends Archivist Meetings:   Marland Kitchen Marital Status:   Intimate Partner Violence:   . Fear of Current or Ex-Partner:   . Emotionally Abused:   Marland Kitchen Physically Abused:   . Sexually Abused:       Review of Systems  Constitutional: Negative for chills,  fatigue, fever and unexpected weight change.  Respiratory: Negative for cough, chest tightness, shortness of breath and wheezing.   Cardiovascular: Negative for chest pain and leg swelling.  Gastrointestinal: Negative for abdominal distention, constipation, diarrhea, nausea and vomiting.  Neurological: Negative for dizziness, weakness, light-headedness and headaches.  Hematological: Does not bruise/bleed easily.       Objective:    BP (!) 159/95   Pulse 71   Wt 145 lb (65.8 kg)   BMI 29.29 kg/m  Nursing note and vital signs reviewed.  Physical Exam Constitutional:      General: She is not in acute distress.    Appearance: She is well-developed.  Cardiovascular:     Rate and Rhythm: Normal rate and regular rhythm.     Heart sounds: Normal heart sounds. No murmur. No friction rub. No gallop.   Pulmonary:     Effort: Pulmonary effort is normal. No respiratory distress.     Breath sounds: Normal breath sounds. No wheezing or rales.  Chest:     Chest wall: No tenderness.  Abdominal:     General: Bowel sounds are normal. There is no distension.     Palpations: Abdomen is soft. There is no mass.     Tenderness: There is no abdominal tenderness. There is no guarding or rebound.  Skin:    General: Skin is warm and dry.  Neurological:     Mental Status: She is alert and oriented to person, place, and time.  Psychiatric:        Behavior: Behavior normal.        Thought Content: Thought content normal.        Judgment: Judgment normal.         Assessment & Plan:   Patient Active Problem List   Diagnosis Date Noted  . Positive hepatitis C antibody test 09/09/2019  . Essential hypertension 08/15/2019  . Body aches 05/22/2019  . GERD (gastroesophageal reflux disease) 03/22/2018  . Cough 05/18/2016  . Hemoptysis 05/18/2016  . Exposure to smoke from wood stove 05/18/2016  . Dizziness 05/17/2016  . Chest pain 05/15/2016  . Dyspnea and respiratory abnormality 05/15/2016  .  Near syncope 05/15/2016  . Travel advice encounter 05/01/2016  . Hypothyroidism 05/01/2016     Problem List Items Addressed This Visit      Other   Positive hepatitis C antibody test - Primary    Ms. Lyons is a 64 y/o female native of [place] with positive Hepatitis C antibody test with previous treatment for Hepatitis C in [place]. Does have nausea likely associated with GERD and has no current RUQ pains. Discussed the pathogenesis, risks if left untreated, resistance, and treatment options for Hepatitis C. Check blood work today including HCV RNA level, genotype, NS5a resistance,  HIV status, and liver function tests. Met with pharmacy staff to complete medication assistance paperwork. Plan for follow up and treatment recommendations, if needed, pending blood work results.       Relevant Orders   HIV antibody (with reflex)   Hepatitis C genotype   Hepatitis C RNA quantitative   Liver Fibrosis, FibroTest-ActiTest   Protime-INR   CBC   Hepatic function panel   HCV Viral RNA Gen3 NS5a Drug Resist- ([facility])       I have discontinued Arnecia Eriksson's omeprazole. I am also having her maintain her losartan and levothyroxine.   Follow-up: Return Pending blood work if needed.    Terri Piedra, MSN, FNP-C Nurse Practitioner [facility] for Infectious Disease [place] number: [phone]

## 2019-09-09 NOTE — Patient Instructions (Signed)
Nice to meet you.  We will check your lab work today and let you know if treatment is needed.  Let us know if you have any questions.  Have a great day!  Limit acetaminophen (Tylenol) usage to no more than 2 grams (2,000 mg) per day.  Avoid alcohol.  Do not share toothbrushes or razors.  Practice safe sex to protect against transmission as well as sexually transmitted disease.    Hepatitis C Hepatitis C is a viral infection of the liver. It can lead to scarring of the liver (cirrhosis), liver failure, or liver cancer. Hepatitis C may go undetected for months or years because people with the infection may not have symptoms, or they may have only mild symptoms. What are the causes? This condition is caused by the hepatitis C virus (HCV). The virus can spread from person to person (is contagious) through:  Blood.  Childbirth. A woman who has hepatitis C can pass it to her baby during birth.  Bodily fluids, such as breast milk, tears, semen, vaginal fluids, and saliva.  Blood transfusions or organ transplants done in the [place] before 1992.  What increases the risk? The following factors may make you more likely to develop this condition:  Having contact with unclean (contaminated) needles or syringes. This may result from: ? Acupuncture. ? Tattoing. ? Body piercing. ? Injecting drugs.  Having unprotected sex with someone who is infected.  Needing treatment to filter your blood (kidney dialysis).  Having HIV (human immunodeficiency virus) or AIDS (acquired immunodeficiency syndrome).  Working in a job that involves contact with blood or bodily fluids, such as health care.  What are the signs or symptoms? Symptoms of this condition include:  Fatigue.  Loss of appetite.  Nausea.  Vomiting.  Abdominal pain.  Dark yellow urine.  Yellowish skin and eyes (jaundice).  Itchy skin.  Clay-colored bowel movements.  Joint pain.  Bleeding and bruising  easily.  Fluid building up in your stomach (ascites).  In some cases, you may not have any symptoms. How is this diagnosed? This condition is diagnosed with:  Blood tests.  Other tests to check how well your liver is functioning. They may include: ? Magnetic resonance elastography (MRE). This imaging test uses MRIs and sound waves to measure liver stiffness. ? Transient elastography. This imaging test uses ultrasounds to measure liver stiffness. ? Liver biopsy. This test requires taking a small tissue sample from your liver to examine it under a microscope.  How is this treated? Your health care provider may perform noninvasive tests or a liver biopsy to help decide the best course of treatment. Treatment may include:  Antiviral medicines and other medicines.  Follow-up treatments every 6-12 months for infections or other liver conditions.  Receiving a donated liver (liver transplant).  Follow these instructions at home: Medicines  Take over-the-counter and prescription medicines only as told by your health care provider.  Take your antiviral medicine as told by your health care provider. Do not stop taking the antiviral even if you start to feel better.  Do not take any medicines unless approved by your health care provider, including over-the-counter medicines and birth control pills. Activity  Rest as needed.  Do not have sex unless approved by your health care provider.  Ask your health care provider when you may return to school or work. Eating and drinking  Eat a balanced diet with plenty of fruits and vegetables, whole grains, and lowfat (lean) meats or non-meat proteins (such as  beans or tofu).  Drink enough fluids to keep your urine clear or pale yellow.  Do not drink alcohol. General instructions  Do not share toothbrushes, nail clippers, or razors.  Wash your hands frequently with soap and water. If soap and water are not available, use hand  sanitizer.  Cover any cuts or open sores on your skin to prevent spreading the virus.  Keep all follow-up visits as told by your health care provider. This is important. You may need follow-up visits every 6-12 months. How is this prevented? There is no vaccine for hepatitis C. The only way to prevent the disease is to reduce the risk of exposure to the virus. Make sure you:  Wash your hands frequently with soap and water. If soap and water are not available, use hand sanitizer.  Do not share needles or syringes.  Practice safe sex and use condoms.  Avoid handling blood or bodily fluids without gloves or other protection.  Avoid getting tattoos or piercings in shops or other locations that are not clean.  Contact a health care provider if:  You have a fever.  You develop abdominal pain.  You pass dark urine.  You pass clay-colored stools.  You develop joint pain. Get help right away if:  You have increasing fatigue or weakness.  You lose your appetite.  You cannot eat or drink without vomiting.  You develop jaundice or your jaundice gets worse.  You bruise or bleed easily. Summary  Hepatitis C is a viral infection of the liver. It can lead to scarring of the liver (cirrhosis), liver failure, or liver cancer.  The hepatitis C virus (HCV) causes this condition. The virus can pass from person to person (is contagious).  You should not take any medicines unless approved by your health care provider. This includes over-the-counter medicines and birth control pills. This information is not intended to replace advice given to you by your health care provider. Make sure you discuss any questions you have with your health care provider. Document Released: 03/30/2000 Document Revised: 05/08/2016 Document Reviewed: 05/08/2016 Elsevier Interactive Patient Education  [employer].

## 2019-09-19 ENCOUNTER — Ambulatory Visit: Payer: Self-pay | Admitting: Internal Medicine

## 2019-09-22 LAB — LIVER FIBROSIS, FIBROTEST-ACTITEST
ALT: 118 U/L — ABNORMAL HIGH (ref 6–29)
Alpha-2-Macroglobulin: 310 mg/dL — ABNORMAL HIGH (ref 106–279)
Apolipoprotein A1: 106 mg/dL (ref 101–198)
Bilirubin: 0.3 mg/dL (ref 0.2–1.2)
Fibrosis Score: 0.71
GGT: 60 U/L (ref 3–65)
Haptoglobin: 32 mg/dL — ABNORMAL LOW (ref 43–212)
Necroinflammat ACT Score: 0.77
Reference ID: 3420692

## 2019-09-22 LAB — HEPATITIS C GENOTYPE: HCV Genotype: 3

## 2019-09-22 LAB — CBC
HCT: 40.7 % (ref 35.0–45.0)
Hemoglobin: 13.7 g/dL (ref 11.7–15.5)
MCH: 30.4 pg (ref 27.0–33.0)
MCHC: 33.7 g/dL (ref 32.0–36.0)
MCV: 90.4 fL (ref 80.0–100.0)
MPV: 11.9 fL (ref 7.5–12.5)
Platelets: 131 10*3/uL — ABNORMAL LOW (ref 140–400)
RBC: 4.5 10*6/uL (ref 3.80–5.10)
RDW: 11 % (ref 11.0–15.0)
WBC: 6.9 10*3/uL (ref 3.8–10.8)

## 2019-09-22 LAB — HCV VIRAL RNA GEN3 NS5A DRUG RESIST

## 2019-09-22 LAB — PROTIME-INR
INR: 1.3 — ABNORMAL HIGH
Prothrombin Time: 13.7 s — ABNORMAL HIGH (ref 9.0–11.5)

## 2019-09-22 LAB — HEPATIC FUNCTION PANEL
AG Ratio: 1 (calc) (ref 1.0–2.5)
ALT: 118 U/L — ABNORMAL HIGH (ref 6–29)
AST: 113 U/L — ABNORMAL HIGH (ref 10–35)
Albumin: 3.4 g/dL — ABNORMAL LOW (ref 3.6–5.1)
Alkaline phosphatase (APISO): 192 U/L — ABNORMAL HIGH (ref 37–153)
Bilirubin, Direct: 0.1 mg/dL (ref 0.0–0.2)
Globulin: 3.3 g/dL (calc) (ref 1.9–3.7)
Indirect Bilirubin: 0.2 mg/dL (calc) (ref 0.2–1.2)
Total Bilirubin: 0.3 mg/dL (ref 0.2–1.2)
Total Protein: 6.7 g/dL (ref 6.1–8.1)

## 2019-09-22 LAB — HEPATITIS C RNA QUANTITATIVE
HCV Quantitative Log: 6.48 Log IU/mL — ABNORMAL HIGH
HCV RNA, PCR, QN: 3020000 IU/mL — ABNORMAL HIGH

## 2019-09-22 LAB — HIV ANTIBODY (ROUTINE TESTING W REFLEX): HIV 1&2 Ab, 4th Generation: NONREACTIVE

## 2019-10-06 ENCOUNTER — Telehealth: Payer: Self-pay | Admitting: Pharmacy Technician

## 2019-10-06 NOTE — Telephone Encounter (Addendum)
[facility] Patient Advocate Encounter   Received notification from [facility] that prior authorization for Mavyret is required.   PA submitted on 10/06/2019 Key: [id number] Status is APPROVED  Gwyndolyn Saxon, daughter in law, is approved to speak for the patient.  I tried calling, no answer and voicemail is full.    [facility] Clinic will continue to follow.   Netty Starring. Dimas Aguas CPhT Specialty Pharmacy Patient [facility] for Infectious Disease Phone: [phone] Fax:  [phone]

## 2019-10-07 ENCOUNTER — Other Ambulatory Visit: Payer: Self-pay | Admitting: Pharmacist

## 2019-10-07 DIAGNOSIS — R768 Other specified abnormal immunological findings in serum: Secondary | ICD-10-CM

## 2019-10-07 MED ORDER — GLECAPREVIR-PIBRENTASVIR 100-40 MG PO TABS: 3.0000 | ORAL_TABLET | Freq: Every day | ORAL | 1 refills | Status: DC

## 2019-10-09 ENCOUNTER — Other Ambulatory Visit: Payer: Self-pay | Admitting: Family

## 2019-10-12 NOTE — Telephone Encounter (Signed)
[facility] will fill the medication and ship it directly to her home.  I gave [facility] a copay card that will bring the cost down to $5, if needed, and also gave her daughter in law's name Afshan Haberland and phone number (she speaks [place]).  Netty Starring. Dimas Aguas CPhT Specialty Pharmacy Patient [facility] for Infectious Disease Phone: [phone] Fax:  [phone]

## 2019-10-17 ENCOUNTER — Ambulatory Visit: Payer: Self-pay | Admitting: Internal Medicine

## 2019-10-24 ENCOUNTER — Ambulatory Visit: Payer: Self-pay | Admitting: Adult Health

## 2019-10-26 NOTE — Telephone Encounter (Signed)
[facility] and I have reached out multiple times and with several phone numbers.  There is no answer and no return call.  Alyssa Lyons. Dimas Aguas CPhT Specialty Pharmacy Patient [facility] for Infectious Disease Phone: [phone] Fax:  [phone]

## 2019-11-07 ENCOUNTER — Ambulatory Visit: Payer: Self-pay | Admitting: Internal Medicine

## 2019-12-23 ENCOUNTER — Telehealth: Payer: Self-pay | Admitting: Pharmacist

## 2019-12-23 ENCOUNTER — Encounter: Payer: Self-pay | Admitting: Pharmacy Technician

## 2019-12-23 NOTE — Telephone Encounter (Signed)
Son, Alyssa Lyons, called in and will call [facility] [phone] to get Mavyret shipped to his home.  Follow up appointment has been scheduled.  Netty Starring. Dimas Aguas CPhT Specialty Pharmacy Patient [facility] for Infectious Disease Phone: [phone] Fax:  [phone]

## 2019-12-23 NOTE — Telephone Encounter (Signed)
Patient is approved to receive Mavyret x 8 weeks for chronic Hepatitis C infection. Counseled patient to take all three tablets of Mavyret daily with food.  Counseled patient the need to take all three tablets together and to not separate them out during the day. Encouraged patient not to miss any doses and explained how their chance of cure could go down with each dose missed. Counseled patient on what to do if dose is missed - if it is closer to the missed dose take immediately; if closer to next dose then skip dose and take the next dose at the usual time.   Counseled patient on common side effects such as headache, fatigue, and nausea and that these normally decrease with time. I reviewed patient medications and found no drug interactions. Discussed with patient that there are several drug interactions with Mavyret and instructed patient to call the clinic if she wishes to start a new medication during course of therapy. Also advised patient to call if she experiences any side effects. Patient will follow-up with me in the pharmacy clinic on 10/20.

## 2019-12-24 ENCOUNTER — Telehealth: Payer: Self-pay | Admitting: Pharmacy Technician

## 2019-12-24 ENCOUNTER — Other Ambulatory Visit (HOSPITAL_COMMUNITY): Payer: Self-pay | Admitting: Internal Medicine

## 2019-12-24 ENCOUNTER — Other Ambulatory Visit: Payer: Self-pay | Admitting: Pharmacist

## 2019-12-24 DIAGNOSIS — R768 Other specified abnormal immunological findings in serum: Secondary | ICD-10-CM

## 2019-12-24 DIAGNOSIS — M7989 Other specified soft tissue disorders: Secondary | ICD-10-CM

## 2019-12-24 MED ORDER — MAVYRET 100-40 MG PO TABS: 3.0000 | ORAL_TABLET | Freq: Every day | ORAL | 1 refills | Status: AC

## 2019-12-24 NOTE — Progress Notes (Signed)
Resending brand Mavyret to [employer].

## 2019-12-24 NOTE — Telephone Encounter (Addendum)
[facility] Patient Advocate Encounter   Received notification from [facility] that prior authorization for Mavyret is required.  This is second prior authorization due to patient not being reachable.   PA submitted on 12/24/2019 Status is APPROVED  [facility] will ship to patient    [facility] Clinic will continue to follow.   Netty Starring. Dimas Aguas CPhT Specialty Pharmacy Patient [facility] for Infectious Disease Phone: [phone] Fax:  [phone]

## 2019-12-25 ENCOUNTER — Ambulatory Visit (HOSPITAL_COMMUNITY)
Admission: RE | Admit: 2019-12-25 | Discharge: 2019-12-25 | Disposition: A | Discharge status: Home / Self Care | Payer: 59 | Source: Ambulatory Visit | Attending: Internal Medicine | Admitting: Internal Medicine

## 2019-12-25 ENCOUNTER — Other Ambulatory Visit: Payer: Self-pay

## 2019-12-25 DIAGNOSIS — M79604 Pain in right leg: Secondary | ICD-10-CM

## 2019-12-25 DIAGNOSIS — M7989 Other specified soft tissue disorders: Secondary | ICD-10-CM

## 2019-12-25 NOTE — Progress Notes (Signed)
Carotid duplex has been completed.   Preliminary results in CV Proc.   Blanch Media 12/25/2019 2:17 PM

## 2020-01-02 ENCOUNTER — Ambulatory Visit: Payer: Self-pay | Admitting: Internal Medicine

## 2020-01-05 NOTE — Telephone Encounter (Signed)
[facility] Patient Advocate Encounter  Prior Authorization for  has been approved.     Effective dates: 12/25/2019 through 02/19/2020   [facility] Clinic will continue to follow.  Clearance Coots, CPhT Specialty Pharmacy Patient [facility] for Infectious Disease Phone: [phone] Fax:  [phone]

## 2020-01-06 ENCOUNTER — Telehealth: Payer: Self-pay

## 2020-01-06 NOTE — Telephone Encounter (Signed)
[facility] Patient Advocate Encounter  Completed and sent [employer] application for Mavyret for this patient who is insured.    Copay was still high with copay card & PAN   Patient is approved 01/06/20 through 07/05/20.   Clearance Coots, CPhT Specialty Pharmacy Patient [facility] for Infectious Disease Phone: [phone] Fax:  [phone]

## 2020-01-06 NOTE — Telephone Encounter (Signed)
[facility] Patient Advocate Encounter   I was successful in securing patient an $6000.00 grant from Patient [employer] [facility]) to provide copayment coverage for Mavyret.  E   I have spoken with the patient.    The billing information is as follows and has been shared with [employer] .   Member ID: [id number] Group ID: [id number] RxBin: [id number] Dates of Eligibility: 10/06/19 through 01/02/21  Patient knows to call the office with questions or concerns.  Clearance Coots, CPhT Specialty Pharmacy Patient [facility] for Infectious Disease Phone: [phone] Fax:  [phone]

## 2020-01-17 ENCOUNTER — Other Ambulatory Visit: Payer: Self-pay

## 2020-01-17 ENCOUNTER — Encounter (HOSPITAL_COMMUNITY): Payer: Self-pay | Admitting: Emergency Medicine

## 2020-01-17 ENCOUNTER — Emergency Department (HOSPITAL_COMMUNITY)
Admission: EM | Admit: 2020-01-17 | Discharge: 2020-01-17 | Disposition: A | Discharge status: Home / Self Care | Payer: 59 | Source: Home / Self Care | Attending: Emergency Medicine | Admitting: Emergency Medicine

## 2020-01-17 ENCOUNTER — Emergency Department (HOSPITAL_COMMUNITY): Payer: 59

## 2020-01-17 DIAGNOSIS — R531 Weakness: Secondary | ICD-10-CM | POA: Insufficient documentation

## 2020-01-17 DIAGNOSIS — I10 Essential (primary) hypertension: Secondary | ICD-10-CM | POA: Insufficient documentation

## 2020-01-17 DIAGNOSIS — M791 Myalgia, unspecified site: Secondary | ICD-10-CM | POA: Insufficient documentation

## 2020-01-17 DIAGNOSIS — I6200 Nontraumatic subdural hemorrhage, unspecified: Secondary | ICD-10-CM | POA: Diagnosis not present

## 2020-01-17 DIAGNOSIS — Z20822 Contact with and (suspected) exposure to covid-19: Secondary | ICD-10-CM | POA: Insufficient documentation

## 2020-01-17 DIAGNOSIS — E039 Hypothyroidism, unspecified: Secondary | ICD-10-CM | POA: Insufficient documentation

## 2020-01-17 DIAGNOSIS — Z79899 Other long term (current) drug therapy: Secondary | ICD-10-CM | POA: Insufficient documentation

## 2020-01-17 LAB — BASIC METABOLIC PANEL
Anion gap: 9 (ref 5–15)
BUN: 8 mg/dL (ref 8–23)
CO2: 25 mmol/L (ref 22–32)
Calcium: 9.8 mg/dL (ref 8.9–10.3)
Chloride: 104 mmol/L (ref 98–111)
Creatinine, Ser: 0.76 mg/dL (ref 0.44–1.00)
GFR calc Af Amer: >60 mL/min (ref >60)
GFR calc non Af Amer: >60 mL/min (ref >60)
Glucose, Bld: 124 mg/dL — ABNORMAL HIGH (ref 70–99)
Potassium: 3.6 mmol/L (ref 3.5–5.1)
Sodium: 138 mmol/L (ref 135–145)

## 2020-01-17 LAB — HEPATIC FUNCTION PANEL
ALT: 85 U/L — ABNORMAL HIGH (ref 0–44)
AST: 77 U/L — ABNORMAL HIGH (ref 15–41)
Albumin: 3.3 g/dL — ABNORMAL LOW (ref 3.5–5.0)
Alkaline Phosphatase: 127 U/L — ABNORMAL HIGH (ref 38–126)
Bilirubin, Direct: 0.2 mg/dL (ref 0.0–0.2)
Indirect Bilirubin: 0.4 mg/dL (ref 0.3–0.9)
Total Bilirubin: 0.6 mg/dL (ref 0.3–1.2)
Total Protein: 8.1 g/dL (ref 6.5–8.1)

## 2020-01-17 LAB — CBC
HCT: 44.4 % (ref 36.0–46.0)
Hemoglobin: 14.4 g/dL (ref 12.0–15.0)
MCH: 30 pg (ref 26.0–34.0)
MCHC: 32.4 g/dL (ref 30.0–36.0)
MCV: 92.5 fL (ref 80.0–100.0)
Platelets: 145 10*3/uL — ABNORMAL LOW (ref 150–400)
RBC: 4.8 MIL/uL (ref 3.87–5.11)
RDW: 11.9 % (ref 11.5–15.5)
WBC: 9.9 10*3/uL (ref 4.0–10.5)
nRBC: 0 % (ref 0.0–0.2)

## 2020-01-17 LAB — URINALYSIS, ROUTINE W REFLEX MICROSCOPIC
Bacteria, UA: NONE SEEN
Bilirubin Urine: NEGATIVE
Glucose, UA: NEGATIVE mg/dL
Hgb urine dipstick: NEGATIVE
Ketones, ur: NEGATIVE mg/dL
Nitrite: NEGATIVE
Protein, ur: NEGATIVE mg/dL
Specific Gravity, Urine: 1.009 (ref 1.005–1.030)
pH: 7 (ref 5.0–8.0)

## 2020-01-17 LAB — RESPIRATORY PANEL BY RT PCR (FLU A&B, COVID)
Influenza A by PCR: NEGATIVE
Influenza B by PCR: NEGATIVE
SARS Coronavirus 2 by RT PCR: NEGATIVE

## 2020-01-17 LAB — TROPONIN I (HIGH SENSITIVITY)
Troponin I (High Sensitivity): 6 ng/L (ref <18)
Troponin I (High Sensitivity): 10 ng/L (ref <18)

## 2020-01-17 LAB — TSH: TSH: 0.366 u[IU]/mL (ref 0.350–4.500)

## 2020-01-17 LAB — LACTIC ACID, PLASMA: Lactic Acid, Venous: 1.3 mmol/L (ref 0.5–1.9)

## 2020-01-17 MED ORDER — SODIUM CHLORIDE 0.9 % IV BOLUS
500.0000 mL | Freq: Once | INTRAVENOUS | Status: AC
  Administered 2020-01-17: 500 mL via INTRAVENOUS

## 2020-01-17 MED ORDER — IBUPROFEN 400 MG PO TABS
600.0000 mg | ORAL_TABLET | Freq: Once | ORAL | Status: DC
  Filled 2020-01-17: qty 1

## 2020-01-17 NOTE — ED Notes (Signed)
Pt ambulated in the room with a two person assist. Stated that she still was a little weak.

## 2020-01-17 NOTE — Discharge Instructions (Signed)
Please read and follow all provided instructions.  Your diagnoses today include:  1. Generalized weakness   2. Myalgia     Tests performed today include: Blood cell counts - were normal Electrolytes and kidney function - were normal Urine test - show white blood cells in the urine, no bacteria Urine culture - pending, will need followed by primary care doctor Covid/Flu test - negative Chest x-ray - no pneumonia or other problems Troponin - blood test for strain on the heart, was normal  Vital signs. See below for your results today.   Medications prescribed:   None  Take any prescribed medications only as directed.  Home care instructions:  Follow any educational materials contained in this packet.  BE VERY CAREFUL not to take multiple medicines containing Tylenol (also called acetaminophen). Doing so can lead to an overdose which can damage your liver and cause liver failure and possibly death.   Follow-up instructions: Please follow-up with your primary care provider in the next 2 days for further evaluation of your symptoms.   Return instructions:   Please return to the Emergency Department if you experience worsening symptoms.   Please return if you have any other emergent concerns.  Additional Information:  Your vital signs today were: BP (!) 172/107   Pulse 98   Temp 99.2 F (37.3 C) (Oral)   Resp (!) 26   SpO2 99%  If your blood pressure (BP) was elevated above 135/85 this visit, please have this repeated by your doctor within one month. --------------
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
## 2020-01-17 NOTE — ED Triage Notes (Signed)
Per daughter pt has pain to center of chest and SOB x 3 days.  Denies nausea and vomiting.

## 2020-01-18 ENCOUNTER — Inpatient Hospital Stay (HOSPITAL_COMMUNITY)
Admission: EM | Admit: 2020-01-18 | Discharge: 2020-01-21 | DRG: 025 | Disposition: A | Discharge status: Home Health | Payer: 59 | Attending: Neurological Surgery | Admitting: Neurological Surgery

## 2020-01-18 ENCOUNTER — Emergency Department (HOSPITAL_COMMUNITY): Payer: 59 | Admitting: Registered Nurse

## 2020-01-18 ENCOUNTER — Encounter (HOSPITAL_COMMUNITY): Payer: Self-pay | Admitting: *Deleted

## 2020-01-18 ENCOUNTER — Other Ambulatory Visit: Payer: Self-pay

## 2020-01-18 ENCOUNTER — Inpatient Hospital Stay (HOSPITAL_COMMUNITY): Payer: 59

## 2020-01-18 ENCOUNTER — Emergency Department (HOSPITAL_COMMUNITY): Payer: 59

## 2020-01-18 ENCOUNTER — Encounter (HOSPITAL_COMMUNITY)
Admission: EM | Disposition: A | Discharge status: Home / Self Care | Payer: Self-pay | Source: Home / Self Care | Attending: Neurological Surgery

## 2020-01-18 DIAGNOSIS — Z8349 Family history of other endocrine, nutritional and metabolic diseases: Secondary | ICD-10-CM | POA: Diagnosis not present

## 2020-01-18 DIAGNOSIS — R410 Disorientation, unspecified: Secondary | ICD-10-CM | POA: Diagnosis present

## 2020-01-18 DIAGNOSIS — Z79890 Hormone replacement therapy (postmenopausal): Secondary | ICD-10-CM

## 2020-01-18 DIAGNOSIS — Z8249 Family history of ischemic heart disease and other diseases of the circulatory system: Secondary | ICD-10-CM | POA: Diagnosis not present

## 2020-01-18 DIAGNOSIS — E039 Hypothyroidism, unspecified: Secondary | ICD-10-CM | POA: Diagnosis present

## 2020-01-18 DIAGNOSIS — B1920 Unspecified viral hepatitis C without hepatic coma: Secondary | ICD-10-CM | POA: Diagnosis present

## 2020-01-18 DIAGNOSIS — Z79899 Other long term (current) drug therapy: Secondary | ICD-10-CM

## 2020-01-18 DIAGNOSIS — S065XAA Traumatic subdural hemorrhage with loss of consciousness status unknown, initial encounter: Secondary | ICD-10-CM | POA: Diagnosis present

## 2020-01-18 DIAGNOSIS — Z20822 Contact with and (suspected) exposure to covid-19: Secondary | ICD-10-CM | POA: Diagnosis present

## 2020-01-18 DIAGNOSIS — M79605 Pain in left leg: Secondary | ICD-10-CM

## 2020-01-18 DIAGNOSIS — I6200 Nontraumatic subdural hemorrhage, unspecified: Secondary | ICD-10-CM | POA: Diagnosis present

## 2020-01-18 DIAGNOSIS — G935 Compression of brain: Secondary | ICD-10-CM | POA: Diagnosis present

## 2020-01-18 DIAGNOSIS — I10 Essential (primary) hypertension: Secondary | ICD-10-CM | POA: Diagnosis present

## 2020-01-18 DIAGNOSIS — Z9889 Other specified postprocedural states: Secondary | ICD-10-CM

## 2020-01-18 DIAGNOSIS — M7989 Other specified soft tissue disorders: Secondary | ICD-10-CM | POA: Diagnosis not present

## 2020-01-18 DIAGNOSIS — G8191 Hemiplegia, unspecified affecting right dominant side: Secondary | ICD-10-CM | POA: Diagnosis present

## 2020-01-18 DIAGNOSIS — K219 Gastro-esophageal reflux disease without esophagitis: Secondary | ICD-10-CM | POA: Diagnosis present

## 2020-01-18 DIAGNOSIS — M79609 Pain in unspecified limb: Secondary | ICD-10-CM | POA: Diagnosis not present

## 2020-01-18 HISTORY — PX: BURR HOLE: SHX908

## 2020-01-18 LAB — URINE CULTURE

## 2020-01-18 LAB — CBC
HCT: 43.4 % (ref 36.0–46.0)
Hemoglobin: 14.3 g/dL (ref 12.0–15.0)
MCH: 30.5 pg (ref 26.0–34.0)
MCHC: 32.9 g/dL (ref 30.0–36.0)
MCV: 92.5 fL (ref 80.0–100.0)
Platelets: 179 10*3/uL (ref 150–400)
RBC: 4.69 MIL/uL (ref 3.87–5.11)
RDW: 12 % (ref 11.5–15.5)
WBC: 13.8 10*3/uL — ABNORMAL HIGH (ref 4.0–10.5)
nRBC: 0 % (ref 0.0–0.2)

## 2020-01-18 LAB — DIFFERENTIAL
Abs Immature Granulocytes: 0.05 10*3/uL (ref 0.00–0.07)
Basophils Absolute: 0.1 10*3/uL (ref 0.0–0.1)
Basophils Relative: 0 %
Eosinophils Absolute: 0 10*3/uL (ref 0.0–0.5)
Eosinophils Relative: 0 %
Immature Granulocytes: 0 %
Lymphocytes Relative: 25 %
Lymphs Abs: 3.5 10*3/uL (ref 0.7–4.0)
Monocytes Absolute: 1.3 10*3/uL — ABNORMAL HIGH (ref 0.1–1.0)
Monocytes Relative: 10 %
Neutro Abs: 8.8 10*3/uL — ABNORMAL HIGH (ref 1.7–7.7)
Neutrophils Relative %: 65 %

## 2020-01-18 LAB — CBG MONITORING, ED: Glucose-Capillary: 124 mg/dL — ABNORMAL HIGH (ref 70–99)

## 2020-01-18 LAB — COMPREHENSIVE METABOLIC PANEL
ALT: 68 U/L — ABNORMAL HIGH (ref 0–44)
AST: 56 U/L — ABNORMAL HIGH (ref 15–41)
Albumin: 3.2 g/dL — ABNORMAL LOW (ref 3.5–5.0)
Alkaline Phosphatase: 121 U/L (ref 38–126)
Anion gap: 10 (ref 5–15)
BUN: 8 mg/dL (ref 8–23)
CO2: 22 mmol/L (ref 22–32)
Calcium: 9.9 mg/dL (ref 8.9–10.3)
Chloride: 103 mmol/L (ref 98–111)
Creatinine, Ser: 0.67 mg/dL (ref 0.44–1.00)
GFR calc Af Amer: >60 mL/min (ref >60)
GFR calc non Af Amer: >60 mL/min (ref >60)
Glucose, Bld: 131 mg/dL — ABNORMAL HIGH (ref 70–99)
Potassium: 4.1 mmol/L (ref 3.5–5.1)
Sodium: 135 mmol/L (ref 135–145)
Total Bilirubin: 0.8 mg/dL (ref 0.3–1.2)
Total Protein: 8 g/dL (ref 6.5–8.1)

## 2020-01-18 LAB — RESPIRATORY PANEL BY RT PCR (FLU A&B, COVID)
Influenza A by PCR: NEGATIVE
Influenza B by PCR: NEGATIVE
SARS Coronavirus 2 by RT PCR: NEGATIVE

## 2020-01-18 LAB — PROTIME-INR
INR: 1.3 — ABNORMAL HIGH (ref 0.8–1.2)
Prothrombin Time: 15.3 seconds — ABNORMAL HIGH (ref 11.4–15.2)

## 2020-01-18 LAB — APTT: aPTT: 38 seconds — ABNORMAL HIGH (ref 24–36)

## 2020-01-18 SURGERY — CREATION, CRANIAL BURR HOLE
Anesthesia: General | Site: Head | Laterality: Left

## 2020-01-18 MED ORDER — FENTANYL CITRATE (PF) 250 MCG/5ML IJ SOLN
INTRAMUSCULAR | Status: AC
  Filled 2020-01-18: qty 5

## 2020-01-18 MED ORDER — ACETAMINOPHEN 325 MG PO TABS: 650.0000 mg | ORAL_TABLET | ORAL | Status: DC | PRN

## 2020-01-18 MED ORDER — HYDROCODONE-ACETAMINOPHEN 5-325 MG PO TABS
1.0000 | ORAL_TABLET | ORAL | Status: DC | PRN
  Administered 2020-01-18 – 2020-01-21 (×5): 1 via ORAL
  Filled 2020-01-18 (×5): qty 1

## 2020-01-18 MED ORDER — SODIUM CHLORIDE 0.9% FLUSH: 3.0000 mL | Freq: Once | INTRAVENOUS | Status: DC

## 2020-01-18 MED ORDER — THROMBIN 5000 UNITS EX SOLR
CUTANEOUS | Status: DC | PRN
  Administered 2020-01-18: 5000 [IU] via TOPICAL

## 2020-01-18 MED ORDER — CEFAZOLIN SODIUM-DEXTROSE 2-3 GM-%(50ML) IV SOLR
INTRAVENOUS | Status: DC | PRN
  Administered 2020-01-18: 2 g via INTRAVENOUS

## 2020-01-18 MED ORDER — SUCCINYLCHOLINE CHLORIDE 200 MG/10ML IV SOSY
PREFILLED_SYRINGE | INTRAVENOUS | Status: DC | PRN
  Administered 2020-01-18: 100 mg via INTRAVENOUS

## 2020-01-18 MED ORDER — ONDANSETRON HCL 4 MG PO TABS: 4.0000 mg | ORAL_TABLET | ORAL | Status: DC | PRN

## 2020-01-18 MED ORDER — LIDOCAINE-EPINEPHRINE 1 %-1:100000 IJ SOLN
INTRAMUSCULAR | Status: AC
  Filled 2020-01-18: qty 1

## 2020-01-18 MED ORDER — THROMBIN 5000 UNITS EX SOLR
OROMUCOSAL | Status: DC | PRN
  Administered 2020-01-18: 5 mL via TOPICAL

## 2020-01-18 MED ORDER — LEVOTHYROXINE SODIUM 75 MCG PO TABS
75.0000 ug | ORAL_TABLET | Freq: Every day | ORAL | Status: DC
  Administered 2020-01-19 – 2020-01-21 (×3): 75 ug via ORAL
  Filled 2020-01-18 (×3): qty 1

## 2020-01-18 MED ORDER — CEFAZOLIN SODIUM-DEXTROSE 2-4 GM/100ML-% IV SOLN
2.0000 g | Freq: Three times a day (TID) | INTRAVENOUS | Status: AC
  Administered 2020-01-19 (×2): 2 g via INTRAVENOUS
  Filled 2020-01-18 (×2): qty 100

## 2020-01-18 MED ORDER — HYDROMORPHONE HCL 1 MG/ML IJ SOLN: 0.5000 mg | INTRAMUSCULAR | Status: DC | PRN

## 2020-01-18 MED ORDER — ONDANSETRON HCL 4 MG/2ML IJ SOLN: 4.0000 mg | INTRAMUSCULAR | Status: DC | PRN

## 2020-01-18 MED ORDER — ESMOLOL HCL 100 MG/10ML IV SOLN
INTRAVENOUS | Status: DC | PRN
  Administered 2020-01-18 (×2): 20 mg via INTRAVENOUS
  Administered 2020-01-18: 10 mg via INTRAVENOUS

## 2020-01-18 MED ORDER — CHLORHEXIDINE GLUCONATE CLOTH 2 % EX PADS
6.0000 | MEDICATED_PAD | Freq: Every day | CUTANEOUS | Status: DC
  Administered 2020-01-19 – 2020-01-20 (×2): 6 via TOPICAL

## 2020-01-18 MED ORDER — PROMETHAZINE HCL 12.5 MG PO TABS
12.5000 mg | ORAL_TABLET | ORAL | Status: DC | PRN
  Filled 2020-01-18: qty 2

## 2020-01-18 MED ORDER — POLYETHYLENE GLYCOL 3350 17 G PO PACK: 17.0000 g | PACK | Freq: Every day | ORAL | Status: DC | PRN

## 2020-01-18 MED ORDER — BACITRACIN ZINC 500 UNIT/GM EX OINT
TOPICAL_OINTMENT | CUTANEOUS | Status: AC
  Filled 2020-01-18: qty 28.35

## 2020-01-18 MED ORDER — FENTANYL CITRATE (PF) 250 MCG/5ML IJ SOLN
INTRAMUSCULAR | Status: DC | PRN
Start: 2020-01-18 — End: 2020-01-18
  Administered 2020-01-18: 50 ug via INTRAVENOUS
  Administered 2020-01-18: 25 ug via INTRAVENOUS

## 2020-01-18 MED ORDER — SUGAMMADEX SODIUM 200 MG/2ML IV SOLN
INTRAVENOUS | Status: DC | PRN
  Administered 2020-01-18: 200 mg via INTRAVENOUS

## 2020-01-18 MED ORDER — SODIUM CHLORIDE 0.9 % IV SOLN: INTRAVENOUS | Status: DC | PRN

## 2020-01-18 MED ORDER — 0.9 % SODIUM CHLORIDE (POUR BTL) OPTIME
TOPICAL | Status: DC | PRN
  Administered 2020-01-18: 1000 mL

## 2020-01-18 MED ORDER — HEPARIN SODIUM (PORCINE) 5000 UNIT/ML IJ SOLN: 5000.0000 [IU] | Freq: Three times a day (TID) | INTRAMUSCULAR | Status: DC

## 2020-01-18 MED ORDER — PROPOFOL 10 MG/ML IV BOLUS
INTRAVENOUS | Status: DC | PRN
  Administered 2020-01-18: 140 mg via INTRAVENOUS

## 2020-01-18 MED ORDER — ESMOLOL HCL 100 MG/10ML IV SOLN
INTRAVENOUS | Status: AC
  Filled 2020-01-18: qty 10

## 2020-01-18 MED ORDER — PHENYLEPHRINE 40 MCG/ML (10ML) SYRINGE FOR IV PUSH (FOR BLOOD PRESSURE SUPPORT)
PREFILLED_SYRINGE | INTRAVENOUS | Status: DC | PRN
  Administered 2020-01-18 (×3): 80 ug via INTRAVENOUS

## 2020-01-18 MED ORDER — BACITRACIN ZINC 500 UNIT/GM EX OINT
TOPICAL_OINTMENT | CUTANEOUS | Status: DC | PRN
  Administered 2020-01-18: 1 via TOPICAL

## 2020-01-18 MED ORDER — ACETAMINOPHEN 650 MG RE SUPP: 650.0000 mg | RECTAL | Status: DC | PRN

## 2020-01-18 MED ORDER — ONDANSETRON HCL 4 MG/2ML IJ SOLN
INTRAMUSCULAR | Status: DC | PRN
  Administered 2020-01-18: 4 mg via INTRAVENOUS

## 2020-01-18 MED ORDER — ROCURONIUM BROMIDE 10 MG/ML (PF) SYRINGE
PREFILLED_SYRINGE | INTRAVENOUS | Status: DC | PRN
  Administered 2020-01-18: 30 mg via INTRAVENOUS

## 2020-01-18 MED ORDER — LOSARTAN POTASSIUM 50 MG PO TABS
50.0000 mg | ORAL_TABLET | Freq: Every day | ORAL | Status: DC
  Administered 2020-01-19 – 2020-01-21 (×3): 50 mg via ORAL
  Filled 2020-01-18 (×3): qty 1

## 2020-01-18 MED ORDER — DOCUSATE SODIUM 100 MG PO CAPS
100.0000 mg | ORAL_CAPSULE | Freq: Two times a day (BID) | ORAL | Status: DC
  Administered 2020-01-18 – 2020-01-21 (×6): 100 mg via ORAL
  Filled 2020-01-18 (×6): qty 1

## 2020-01-18 MED ORDER — HEMOSTATIC AGENTS (NO CHARGE) OPTIME
TOPICAL | Status: DC | PRN
  Administered 2020-01-18: 1 via TOPICAL

## 2020-01-18 MED ORDER — FAMOTIDINE IN NACL 20-0.9 MG/50ML-% IV SOLN
20.0000 mg | Freq: Two times a day (BID) | INTRAVENOUS | Status: DC
  Administered 2020-01-18 – 2020-01-19 (×3): 20 mg via INTRAVENOUS
  Filled 2020-01-18 (×4): qty 50

## 2020-01-18 MED ORDER — DEXAMETHASONE SODIUM PHOSPHATE 10 MG/ML IJ SOLN
INTRAMUSCULAR | Status: DC | PRN
  Administered 2020-01-18: 4 mg via INTRAVENOUS

## 2020-01-18 MED ORDER — THROMBIN 5000 UNITS EX SOLR
CUTANEOUS | Status: AC
  Filled 2020-01-18: qty 15000

## 2020-01-18 MED ORDER — LABETALOL HCL 5 MG/ML IV SOLN: 10.0000 mg | INTRAVENOUS | Status: DC | PRN

## 2020-01-18 MED ORDER — LIDOCAINE 2% (20 MG/ML) 5 ML SYRINGE
INTRAMUSCULAR | Status: DC | PRN
  Administered 2020-01-18: 80 mg via INTRAVENOUS

## 2020-01-18 MED ORDER — LIDOCAINE-EPINEPHRINE 1 %-1:100000 IJ SOLN
INTRAMUSCULAR | Status: DC | PRN
  Administered 2020-01-18: 10 mL via INTRADERMAL

## 2020-01-18 MED ORDER — PROPOFOL 10 MG/ML IV BOLUS
INTRAVENOUS | Status: AC
  Filled 2020-01-18: qty 20

## 2020-01-18 SURGICAL SUPPLY — 70 items
BLADE CLIPPER SURG (BLADE) ×3 IMPLANT
BNDG GAUZE ELAST 4 BULKY (GAUZE/BANDAGES/DRESSINGS) IMPLANT
BUR ACORN 9.0 PRECISION (BURR) ×2 IMPLANT
BUR ACORN 9.0MM PRECISION (BURR) ×1
BUR MATCHSTICK NEURO 3.0 LAGG (BURR) IMPLANT
BUR SPIRAL ROUTER 2.3 (BUR) IMPLANT
BUR SPIRAL ROUTER 2.3MM (BUR)
CANISTER SUCT 3000ML PPV (MISCELLANEOUS) ×3 IMPLANT
CATH VENTRIC 35X38 W/TROCAR LG (CATHETERS) ×3 IMPLANT
CLIP VESOCCLUDE MED 6/CT (CLIP) IMPLANT
COVER WAND RF STERILE (DRAPES) ×3 IMPLANT
DRAPE NEUROLOGICAL W/INCISE (DRAPES) ×3 IMPLANT
DRAPE SHEET LG 3/4 BI-LAMINATE (DRAPES) ×3 IMPLANT
DRAPE SURG 17X23 STRL (DRAPES) IMPLANT
DRAPE WARM FLUID 44X44 (DRAPES) ×3 IMPLANT
DURAPREP 6ML APPLICATOR 50/CS (WOUND CARE) ×3 IMPLANT
ELECT REM PT RETURN 9FT ADLT (ELECTROSURGICAL) ×3
ELECTRODE REM PT RTRN 9FT ADLT (ELECTROSURGICAL) ×1 IMPLANT
EVACUATOR 1/8 PVC DRAIN (DRAIN) IMPLANT
EVACUATOR SILICONE 100CC (DRAIN) IMPLANT
GAUZE 4X4 16PLY RFD (DISPOSABLE) IMPLANT
GAUZE SPONGE 4X4 12PLY STRL (GAUZE/BANDAGES/DRESSINGS) ×3 IMPLANT
GLOVE BIO SURGEON STRL SZ 6.5 (GLOVE) ×2 IMPLANT
GLOVE BIO SURGEON STRL SZ7 (GLOVE) ×3 IMPLANT
GLOVE BIO SURGEON STRL SZ7.5 (GLOVE) ×6 IMPLANT
GLOVE BIO SURGEONS STRL SZ 6.5 (GLOVE) ×1
GLOVE BIOGEL PI IND STRL 6.5 (GLOVE) ×1 IMPLANT
GLOVE BIOGEL PI IND STRL 7.5 (GLOVE) ×3 IMPLANT
GLOVE BIOGEL PI INDICATOR 6.5 (GLOVE) ×2
GLOVE BIOGEL PI INDICATOR 7.5 (GLOVE) ×6
GLOVE EXAM NITRILE LRG STRL (GLOVE) IMPLANT
GLOVE EXAM NITRILE XL STR (GLOVE) IMPLANT
GLOVE EXAM NITRILE XS STR PU (GLOVE) IMPLANT
GOWN STRL REUS W/ TWL LRG LVL3 (GOWN DISPOSABLE) ×2 IMPLANT
GOWN STRL REUS W/ TWL XL LVL3 (GOWN DISPOSABLE) IMPLANT
GOWN STRL REUS W/TWL 2XL LVL3 (GOWN DISPOSABLE) IMPLANT
GOWN STRL REUS W/TWL LRG LVL3 (GOWN DISPOSABLE) ×4
GOWN STRL REUS W/TWL XL LVL3 (GOWN DISPOSABLE)
HEMOSTAT POWDER KIT SURGIFOAM (HEMOSTASIS) ×3 IMPLANT
HEMOSTAT SURGICEL 2X14 (HEMOSTASIS) IMPLANT
HOOK DURA 1/2IN (MISCELLANEOUS) ×3 IMPLANT
KIT BASIN OR (CUSTOM PROCEDURE TRAY) ×3 IMPLANT
KIT DRAIN CSF ACCUDRAIN (MISCELLANEOUS) ×3 IMPLANT
KIT TURNOVER KIT B (KITS) ×3 IMPLANT
NEEDLE HYPO 22GX1.5 SAFETY (NEEDLE) ×3 IMPLANT
NS IRRIG 1000ML POUR BTL (IV SOLUTION) ×3 IMPLANT
PACK CRANIOTOMY CUSTOM (CUSTOM PROCEDURE TRAY) ×3 IMPLANT
PATTIES SURGICAL .5 X.5 (GAUZE/BANDAGES/DRESSINGS) IMPLANT
PATTIES SURGICAL .5 X3 (DISPOSABLE) IMPLANT
PATTIES SURGICAL 1X1 (DISPOSABLE) IMPLANT
SPONGE NEURO XRAY DETECT 1X3 (DISPOSABLE) IMPLANT
SPONGE SURGIFOAM ABS GEL SZ50 (HEMOSTASIS) ×3 IMPLANT
STAPLER VISISTAT 35W (STAPLE) ×3 IMPLANT
STOCKINETTE 6  STRL (DRAPES)
STOCKINETTE 6 STRL (DRAPES) IMPLANT
SUT ETHILON 3 0 FSL (SUTURE) IMPLANT
SUT ETHILON 3 0 PS 1 (SUTURE) IMPLANT
SUT MNCRL AB 3-0 PS2 18 (SUTURE) ×3 IMPLANT
SUT NURALON 4 0 TR CR/8 (SUTURE) ×3 IMPLANT
SUT STEEL 0 (SUTURE)
SUT STEEL 0 18XMFL TIE 17 (SUTURE) IMPLANT
SUT VIC AB 0 CT1 18XCR BRD8 (SUTURE) ×1 IMPLANT
SUT VIC AB 0 CT1 8-18 (SUTURE) ×2
TOWEL GREEN STERILE (TOWEL DISPOSABLE) ×3 IMPLANT
TOWEL GREEN STERILE FF (TOWEL DISPOSABLE) ×3 IMPLANT
TRAY FOLEY MTR SLVR 16FR STAT (SET/KITS/TRAYS/PACK) IMPLANT
TUBE CONNECTING 12'X1/4 (SUCTIONS) ×1
TUBE CONNECTING 12X1/4 (SUCTIONS) ×2 IMPLANT
UNDERPAD 30X36 HEAVY ABSORB (UNDERPADS AND DIAPERS) ×3 IMPLANT
WATER STERILE IRR 1000ML POUR (IV SOLUTION) ×3 IMPLANT

## 2020-01-18 NOTE — Anesthesia Procedure Notes (Signed)
Procedure Name: Intubation Date/Time: 01/18/2020 9:14 PM Performed by: Zollie Scale, CRNA Pre-anesthesia Checklist: Patient identified, Emergency Drugs available, Suction available and Patient being monitored Patient Re-evaluated:Patient Re-evaluated prior to induction Oxygen Delivery Method: Circle System Utilized Preoxygenation: Pre-oxygenation with 100% oxygen Induction Type: IV induction, Rapid sequence and Cricoid Pressure applied Laryngoscope Size: Miller and 2 Grade View: Grade I Tube type: Oral Tube size: 7.0 mm Number of attempts: 1 Airway Equipment and Method: Stylet Placement Confirmation: ETT inserted through vocal cords under direct vision,  positive ETCO2 and breath sounds checked- equal and bilateral Secured at: 22 cm Tube secured with: Tape Dental Injury: Teeth and Oropharynx as per pre-operative assessment

## 2020-01-18 NOTE — Op Note (Signed)
PATIENT: Alyssa Lyons  DAY OF SURGERY: 01/18/20   PRE-OPERATIVE DIAGNOSIS:  Left subdural hematoma   POST-OPERATIVE DIAGNOSIS:  Left subdural hematoma   PROCEDURE:  Left burr hole for evacuation of subdural hematoma   SURGEON:  Surgeon(s) and Role:    Jadene Pierini, MD - Primary    Kennon Portela, NP - Assisting   ANESTHESIA: ETGA   BRIEF HISTORY: This is a 64 year old woman w/ a h/o HCV who presented with right sided weakness. The patient was found to have left greater than right subdural hematomas. This was discussed with the patient and her family as well as risks, benefits, and alternatives and wished to proceed with surgery.   OPERATIVE DETAIL: The patient was taken to the operating room and placed on the OR table in the supine position. A formal time out was performed with two patient identifiers and confirmed the operative site. Anesthesia was induced by the anesthesia team. The operative site was marked, hair was clipped with surgical clippers, the area was then prepped and draped in a sterile fashion. A linear 3cm incision was placed in the left frontal region. After soft tissue dissection, a burr hole was created with a high speed drill. The dura was coagulated and opened with immediate return of chronic blood products. This was copiously irrigated until only clear fluid returned and a ventricular catheter was tunneled into the subdural space using a penfield 3 to direct parallel to the cortex. This was tunneled and secured posteriorly.  All instrument and sponge counts were correct, the incision was then closed in layers. The patient was then returned to anesthesia for emergence. No apparent complications at the completion of the procedure.   EBL:  75mL   DRAINS: Ventricular catheter in the subdural space   SPECIMENS: none   Jadene Pierini, MD 01/18/20 7:42 PM

## 2020-01-18 NOTE — Anesthesia Preprocedure Evaluation (Signed)
Anesthesia Evaluation  Patient identified by MRN, date of birth, ID band Patient confused    Reviewed: Allergy & Precautions, NPO status , Patient's Chart, lab work & pertinent test results  Airway Mallampati: II  TM Distance: >3 FB Neck ROM: Full    Dental  (+) Teeth Intact, Chipped   Pulmonary neg pulmonary ROS,    Pulmonary exam normal breath sounds clear to auscultation       Cardiovascular hypertension, Pt. on medications Normal cardiovascular exam Rhythm:Regular Rate:Normal     Neuro/Psych Sub-dural hematoma negative psych ROS   GI/Hepatic GERD  Medicated,(+) Hepatitis -, C  Endo/Other  Hypothyroidism   Renal/GU negative Renal ROS     Musculoskeletal negative musculoskeletal ROS (+)   Abdominal   Peds  Hematology negative hematology ROS (+)   Anesthesia Other Findings Day of surgery medications reviewed with the patient.  Reproductive/Obstetrics                             Anesthesia Physical Anesthesia Plan  ASA: IV and emergent  Anesthesia Plan: General   Post-op Pain Management:    Induction: Intravenous  PONV Risk Score and Plan: 3 and Dexamethasone and Ondansetron  Airway Management Planned: Oral ETT  Additional Equipment:   Intra-op Plan:   Post-operative Plan: Extubation in OR  Informed Consent: I have reviewed the patients History and Physical, chart, labs and discussed the procedure including the risks, benefits and alternatives for the proposed anesthesia with the patient or authorized representative who has indicated his/her understanding and acceptance.     Consent reviewed with POA  Plan Discussed with: CRNA  Anesthesia Plan Comments:         Anesthesia Quick Evaluation
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
## 2020-01-18 NOTE — ED Notes (Signed)
Dr. Suzanna Obey at bedside using [employer] Interpreter to assess pt.  Pt st's she does not feel like talking right now per interpreter

## 2020-01-18 NOTE — ED Provider Notes (Signed)
I saw and evaluated the patient, reviewed the resident's note and I agree with the findings and plan.  Pertinent History: has had progressive weakness with R > L weakness, not falling - no systemic sx - not following commands well - level 5 caveat applies  =- has not been able to walk in a week without assistance - doeasn't make sense when she talks - she is not able to follow commands and is confused  Pertinent Exam findings: Neuro - difficulty following commands - R sided weakness on exam, mild tachycardia.  She will lift both arms but has some drift down on the R - unable to keep her attention - doesn't understand lifting legs - using interpreter for this.  I discussed case with NS who will come to see pt.  I was personally present and directly supervised the following procedures: - Pt is critically ill with [facility] - without any obvious source.    .Critical Care Performed by: Eber Hong, MD Authorized by: Eber Hong, MD   Critical care provider statement:    Critical care time (minutes):  35   Critical care time was exclusive of:  Separately billable procedures and treating other patients and teaching time   Critical care was necessary to treat or prevent imminent or life-threatening deterioration of the following conditions:  CNS failure or compromise   Critical care was time spent personally by me on the following activities:  Blood draw for specimens, development of treatment plan with patient or surrogate, discussions with consultants, evaluation of patient's response to treatment, examination of patient, obtaining history from patient or surrogate, ordering and performing treatments and interventions, ordering and review of laboratory studies, ordering and review of radiographic studies, pulse oximetry, re-evaluation of patient's condition and review of old charts   Medical rescucitation.  I personally interpreted the EKG as well as the resident and agree with the interpretation  on the resident's chart.  Final diagnoses:  Subdural hematoma (HCC)      Eber Hong, MD 01/19/20 1659

## 2020-01-18 NOTE — H&P (Signed)
Neurosurgery H&P  Reason for Consult: Subdural hematoma Referring Physician: Hyacinth Meeker  CC: Weakness  HPI: This is a 64 y.o. woman w/ h/o HCV, just started HCV therapy that presents with right sided weakness and confusion. The patient speaks Urdu, does not understand English, so we spoke via translator and the patient's family provided some history due to the confusion. She denies any recent head trauma, some headaches but not severe, no N/V. She is normally ambulatory but for the past week she has been unable to stand due to right sided weakness, they had to move her bed downstairs. She lives with family but they ordinarily do not have to help her with ADLs, until this week of course. She is not on any anti-coagulants or anti-platelet agents but is HCV+ and just started therapy a few days ago.    ROS: A 14 point ROS was performed and is negative except as noted in the HPI.   PMHx:  Past Medical History:  Diagnosis Date  . Hepatitis   . Hepatitis C   . Hypertension   . Thyroid disease    FamHx:  Family History  Problem Relation Age of Onset  . Hypertension Mother   . Heart disease Mother   . Hypertension Father   . Heart disease Father   . Hyperlipidemia Brother   . Hypertension Brother   . Hypertension Brother   . Hyperlipidemia Brother   . Hyperlipidemia Brother   . Hypertension Brother    SocHx:  reports that she has never smoked. She has never used smokeless tobacco. She reports that she does not drink alcohol and does not use drugs.  Exam: Vital signs in last 24 hours: Temp:  [99.2 F (37.3 C)-100.2 F (37.9 C)] 99.2 F (37.3 C) (10/04 1720) Pulse Rate:  [84-120] 84 (10/04 1830) Resp:  [18-26] 26 (10/04 1830) BP: (160-186)/(87-93) 162/87 (10/04 1830) SpO2:  [98 %-100 %] 98 % (10/04 1830) General: Awake, mildly somnolent, cooperative, lying in bed in NAD Head: Normocephalic and atruamatic HEENT: Neck supple Pulmonary: breathing room air comfortably, mildly tachypneic  but no evidence of increased work of breathing Cardiac: RRR Abdomen: S NT ND Extremities: Warm and well perfused x4 Neuro: Mildly somnolent, eyes open spontaneous but slow to follow commands and with globally decreased effort. PERRL, gaze neutral, FS Strength 5/5 on L, 3/5 on R but difficult to grade due to effort   Assessment and Plan: 64 y.o. woman with h/o HCV p/w 1 week of R sided weakness. CTH personally reviewed, which shows large left and smaller right mixed density subdural hematomas with brain compression.   -OR for L sided burr hole evacuation, discussed w/ pt's family regarding higher risk of surgery in the setting of hepatic coagulopathy, especially with recurrence or post-op hemorrhage  Jadene Pierini, MD 01/18/20 7:18 PM [facility]

## 2020-01-18 NOTE — ED Triage Notes (Signed)
Family reports being here yesterday for similar symptoms, also had pcp appt today. Reports having fever and weakness. Family noticed today that pt was using right side body very well today but states that she noticed her neglecting her right hand yesterday. Pt hasnt been feeling well, so lying around x 3 days.

## 2020-01-18 NOTE — ED Notes (Signed)
Pt's CBG result was 124. Informed Kim - RN.

## 2020-01-18 NOTE — Transfer of Care (Signed)
Immediate Anesthesia Transfer of Care Note  Patient: Sevin Halabi  Procedure(s) Performed: BURR HOLE Craniotomy (Left Head)  Patient Location: PACU  Anesthesia Type:General  Level of Consciousness: awake and responds to stimulation  Airway & Oxygen Therapy: Patient Spontanous Breathing and Patient connected to nasal cannula oxygen  Post-op Assessment: Report given to RN and Post -op Vital signs reviewed and stable  Post vital signs: Reviewed and stable  Last Vitals:  Vitals Value Taken Time  BP 141/64   Temp    Pulse 99 01/18/20 2220  Resp 21 01/18/20 2220  SpO2 100 % 01/18/20 2220  Vitals shown include unvalidated device data.  Last Pain:  Vitals:   01/18/20 2035  TempSrc:   PainSc: 0-No pain         Complications: No complications documented.

## 2020-01-18 NOTE — ED Notes (Signed)
Pt denies any head pain.

## 2020-01-18 NOTE — Anesthesia Postprocedure Evaluation (Signed)
Anesthesia Post Note  Patient: Alyssa Lyons  Procedure(s) Performed: BURR HOLE Craniotomy (Left Head)     Patient location during evaluation: PACU Anesthesia Type: General Level of consciousness: sedated Pain management: pain level controlled Vital Signs Assessment: post-procedure vital signs reviewed and stable Respiratory status: spontaneous breathing, nonlabored ventilation, respiratory function stable and patient connected to nasal cannula oxygen Cardiovascular status: blood pressure returned to baseline and stable Postop Assessment: no apparent nausea or vomiting Anesthetic complications: no   No complications documented.  Last Vitals:  Vitals:   01/18/20 2234 01/18/20 2300  BP: (!) 150/83 (!) 146/65  Pulse: 97 96  Resp: (!) 23 (!) 22  Temp: 36.6 C   SpO2: 99% 99%    Last Pain:  Vitals:   01/18/20 2234  TempSrc:   PainSc: 0-No pain                 Cecile Hearing

## 2020-01-19 ENCOUNTER — Encounter (HOSPITAL_COMMUNITY): Payer: Self-pay | Admitting: Neurological Surgery

## 2020-01-19 LAB — URINALYSIS, ROUTINE W REFLEX MICROSCOPIC
Bilirubin Urine: NEGATIVE
Glucose, UA: NEGATIVE mg/dL
Hgb urine dipstick: NEGATIVE
Ketones, ur: NEGATIVE mg/dL
Nitrite: NEGATIVE
Protein, ur: NEGATIVE mg/dL
Specific Gravity, Urine: 1.009 (ref 1.005–1.030)
pH: 7 (ref 5.0–8.0)

## 2020-01-19 LAB — CREATININE, SERUM
Creatinine, Ser: 0.68 mg/dL (ref 0.44–1.00)
GFR calc Af Amer: >60 mL/min (ref >60)
GFR calc non Af Amer: >60 mL/min (ref >60)

## 2020-01-19 LAB — CBC
HCT: 39.7 % (ref 36.0–46.0)
Hemoglobin: 13.2 g/dL (ref 12.0–15.0)
MCH: 31.4 pg (ref 26.0–34.0)
MCHC: 33.2 g/dL (ref 30.0–36.0)
MCV: 94.5 fL (ref 80.0–100.0)
Platelets: 127 10*3/uL — ABNORMAL LOW (ref 150–400)
RBC: 4.2 MIL/uL (ref 3.87–5.11)
RDW: 11.9 % (ref 11.5–15.5)
WBC: 10.6 10*3/uL — ABNORMAL HIGH (ref 4.0–10.5)
nRBC: 0 % (ref 0.0–0.2)

## 2020-01-19 LAB — MRSA PCR SCREENING: MRSA by PCR: NEGATIVE

## 2020-01-19 MED ORDER — ROCURONIUM BROMIDE 10 MG/ML (PF) SYRINGE
PREFILLED_SYRINGE | INTRAVENOUS | Status: AC
  Filled 2020-01-19: qty 10

## 2020-01-19 MED ORDER — CEFAZOLIN SODIUM 1 G IJ SOLR
INTRAMUSCULAR | Status: AC
  Filled 2020-01-19: qty 20

## 2020-01-19 MED ORDER — SUCCINYLCHOLINE CHLORIDE 200 MG/10ML IV SOSY
PREFILLED_SYRINGE | INTRAVENOUS | Status: AC
  Filled 2020-01-19: qty 10

## 2020-01-19 MED ORDER — PHENYLEPHRINE 40 MCG/ML (10ML) SYRINGE FOR IV PUSH (FOR BLOOD PRESSURE SUPPORT)
PREFILLED_SYRINGE | INTRAVENOUS | Status: AC
  Filled 2020-01-19: qty 20

## 2020-01-19 MED ORDER — HEPARIN SODIUM (PORCINE) 5000 UNIT/ML IJ SOLN
5000.0000 [IU] | Freq: Three times a day (TID) | INTRAMUSCULAR | Status: DC
  Administered 2020-01-21: 5000 [IU] via SUBCUTANEOUS
  Filled 2020-01-19: qty 1

## 2020-01-19 MED ORDER — EPHEDRINE 5 MG/ML INJ
INTRAVENOUS | Status: AC
  Filled 2020-01-19: qty 10

## 2020-01-19 MED ORDER — ONDANSETRON HCL 4 MG/2ML IJ SOLN
INTRAMUSCULAR | Status: AC
  Filled 2020-01-19: qty 2

## 2020-01-19 MED ORDER — GLECAPREVIR-PIBRENTASVIR 100-40 MG PO TABS
3.0000 | ORAL_TABLET | Freq: Every day | ORAL | Status: DC
  Administered 2020-01-19 – 2020-01-20 (×2): 3 via ORAL
  Filled 2020-01-19 (×4): qty 1

## 2020-01-19 MED ORDER — SODIUM CHLORIDE 0.9 % IV SOLN
INTRAVENOUS | Status: DC | PRN
  Administered 2020-01-19: 500 mL via INTRAVENOUS

## 2020-01-19 MED ORDER — LABETALOL HCL 5 MG/ML IV SOLN
INTRAVENOUS | Status: AC
  Filled 2020-01-19: qty 4

## 2020-01-19 NOTE — Evaluation (Signed)
Physical Therapy Evaluation Patient Details Name: Alyssa Lyons MRN: [id number] DOB: 1956/01/27 Today's Date: 01/19/2020   History of Present Illness  Pt is a 64 y/o female who presented with R side weakness and confusion. CT head shows large L and smaller R mixed density SDHs with brain compression. Pt is now s/p L burr hole craniotomy on 10/4. PMHx includes hepatitis C, HTN     Clinical Impression  Pt admitted with above. Pt's son present to translate. Pt with noted R sided weakness however is able to ambulate with assist. Pt more steady with rolling walker however requiring max verbal cues for walker safety and sequencing. Pt at increased falls risk if ambulating without AD. Pt's son reports family can provide 24/7 assist. Recommend HHPT to advance indep and improve gait and balance to achieve safe mod I level of function.    Follow Up Recommendations Home health PT;Supervision/Assistance - 24 hour    Equipment Recommendations  Rolling walker with 5" wheels    Recommendations for Other Services       Precautions / Restrictions Precautions Precautions: Fall Precaution Comments: ventricular drain set to gravity Restrictions Weight Bearing Restrictions: No      Mobility  Bed Mobility Overal bed mobility: Needs Assistance Bed Mobility: Supine to Sit     Supine to sit: HOB elevated;Min assist (mod directional verbal cues)     General bed mobility comments: mod directional verbal cues however could be due to langauge barrier, minA for lines and safety  Transfers Overall transfer level: Needs assistance Equipment used: 2 person hand held assist Transfers: Sit to/from Stand Sit to Stand: Min guard         General transfer comment: stood from EOB and toilet, close guarding for lines and safety throughout and VCs for safe hand placement  Ambulation/Gait Ambulation/Gait assistance: +2 safety/equipment;Mod assist Gait Distance (Feet): 80 Feet Assistive device: Rolling  walker (2 wheeled) Gait Pattern/deviations: Step-through pattern;Decreased stride length;Drifts right/left Gait velocity: slow Gait velocity interpretation: <1.8 ft/sec, indicate of risk for recurrent falls General Gait Details: ambulated bilat HHA to bathroom, pt unsteady with short step height and length, pt given walker, pt requiring modA for walker management and sequencing, pt consistently vearing to the L despite max verbal and tactile cues given by son in Urdu  2nd person needed for line management, max verbal cues to keep pt inside walker instead of pushing too far forward  Stairs            Wheelchair Mobility    Modified Rankin (Stroke Patients Only) Modified Rankin (Stroke Patients Only) Pre-Morbid Rankin Score: No symptoms Modified Rankin: Moderate disability     Balance Overall balance assessment: Needs assistance Sitting-balance support: Feet supported Sitting balance-Leahy Scale: Fair Sitting balance - Comments: able to maintain static balance without assist, posterior bias when completing LE MMT Postural control: Posterior lean Standing balance support: Single extremity supported;Bilateral upper extremity supported;No upper extremity supported;During functional activity Standing balance-Leahy Scale: Fair Standing balance comment: intermittent bouts of minguard assist during static standing, overall reqiuring external assist for balance                              Pertinent Vitals/Pain Pain Assessment: Faces Faces Pain Scale: Hurts little more Pain Location: headache Pain Descriptors / Indicators: Discomfort;Headache Pain Intervention(s): Monitored during session    Home Living Family/patient expects to be discharged to:: Private residence Living Arrangements: Children;Spouse/significant other Available Help at Discharge: Family;Available 24  hours/day Type of Home: House Home Access: Level entry     Home Layout: Two level;Able to live on  main level with bedroom/bathroom Home Equipment: None      Prior Function Level of Independence: Independent         Comments: family drives     Hand Dominance   Dominant Hand: Right    Extremity/Trunk Assessment   Upper Extremity Assessment Upper Extremity Assessment: Defer to OT evaluation RUE Deficits / Details: RUE grossly weaker than LUE, but functional  RUE Coordination: WNL    Lower Extremity Assessment Lower Extremity Assessment: RLE deficits/detail RLE Deficits / Details: grossly 4/5 compared to L LE, denies any numbness or altered sensation    Cervical / Trunk Assessment Cervical / Trunk Assessment: Kyphotic  Communication   Communication: Prefers language other than English (Urdu, son assisting to interpret today)  Cognition Arousal/Alertness: Awake/alert Behavior During Therapy: WFL for tasks assessed/performed;Flat affect Overall Cognitive Status: Difficult to assess                                 General Comments: following majority of commands but often requires multimodal cueing - suspect partly due to language barrier; pt requiring safety cues during ADL/mobility      General Comments General comments (skin integrity, edema, etc.): VSS, pt with loose dressing over ventric, pt starting dripping blood from hand during ambulation, RN notified    Exercises     Assessment/Plan    PT Assessment Patient needs continued PT services  PT Problem List Decreased strength;Decreased range of motion;Decreased activity tolerance;Decreased balance;Decreased mobility;Decreased coordination;Decreased cognition;Decreased knowledge of use of DME;Decreased safety awareness       PT Treatment Interventions DME instruction;Gait training;Stair training;Functional mobility training;Therapeutic activities;Therapeutic exercise;Balance training    PT Goals (Current goals can be found in the Care Plan section)  Acute Rehab PT Goals Patient Stated Goal:  none stated, agreeagble to working with therapies  PT Goal Formulation: With patient/family Time For Goal Achievement: 02/02/20 Potential to Achieve Goals: Good    Frequency Min 4X/week   Barriers to discharge        Co-evaluation PT/OT/SLP Co-Evaluation/Treatment: Yes Reason for Co-Treatment: Complexity of the patient's impairments (multi-system involvement) PT goals addressed during session: Mobility/safety with mobility OT goals addressed during session: ADL's and self-care       AM-PAC PT "6 Clicks" Mobility  Outcome Measure Help needed turning from your back to your side while in a flat bed without using bedrails?: A Little Help needed moving from lying on your back to sitting on the side of a flat bed without using bedrails?: A Little Help needed moving to and from a bed to a chair (including a wheelchair)?: A Little Help needed standing up from a chair using your arms (e.g., wheelchair or bedside chair)?: A Little Help needed to walk in hospital room?: A Lot Help needed climbing 3-5 steps with a railing? : A Lot 6 Click Score: 16    End of Session Equipment Utilized During Treatment: Gait belt Activity Tolerance: Patient tolerated treatment well Patient left: in chair;with call bell/phone within reach;with chair alarm set;with family/visitor present (son) Nurse Communication: Mobility status PT Visit Diagnosis: Unsteadiness on feet (R26.81);Difficulty in walking, not elsewhere classified (R26.2)    Time: 4709-6283 PT Time Calculation (min) (ACUTE ONLY): 44 min   Charges:   PT Evaluation $PT Eval Moderate Complexity: 1 Mod  Lewis Shock, PT, DPT Acute Rehabilitation Services Pager #: [phone] Office #: [phone]   Iona Hansen 01/19/2020, 12:55 PM

## 2020-01-19 NOTE — Evaluation (Addendum)
Occupational Therapy Evaluation Patient Details Name: Alyssa Lyons MRN: [id number] DOB: 1955/08/22 Today's Date: 01/19/2020    History of Present Illness Pt is a 64 y/o female who presented with R side weakness and confusion. CT head shows large L and smaller R mixed density SDHs with brain compression. Pt is now s/p L burr hole craniotomy on 10/4. PMHx includes hepatitis C, HTN    Clinical Impression   This 64 y/o female presents with the above. PTA pt living with family and typically independent with ADL and mobility. Pt currently presenting with the above and below listed deficits. Today she requires minA (+2 safety initially) for mobility tasks (initially via HHA, trialled use of RW for further distance mobility), requiring overall minA for ADL given increased time/effort to perform. Pt's son present and assisting to interpret throughout. He reports family is able to provide necessary supervision/assist at time of discharge. Pt to benefit from continued acute OT services and currently recommend follow up [facility] services after discharge to maximize her safety and independence with ADL and mobility.     Follow Up Recommendations  Home health OT;Supervision/Assistance - 24 hour    Equipment Recommendations  Tub/shower seat           Precautions / Restrictions Precautions Precautions: Fall Precaution Comments: ventricular drain Restrictions Weight Bearing Restrictions: No      Mobility Bed Mobility Overal bed mobility: Needs Assistance Bed Mobility: Supine to Sit     Supine to sit: Min guard;HOB elevated     General bed mobility comments: for lines and safety  Transfers Overall transfer level: Needs assistance Equipment used: 2 person hand held assist Transfers: Sit to/from Stand Sit to Stand: Min guard         General transfer comment: stood from EOB and toilet, close guarding for lines and safety throughout and VCs for safe hand placement    Balance Overall  balance assessment: Needs assistance Sitting-balance support: Feet supported Sitting balance-Leahy Scale: Fair Sitting balance - Comments: able to maintain static balance without assist, posterior bias when completing LE MMT Postural control: Posterior lean Standing balance support: Single extremity supported;Bilateral upper extremity supported;No upper extremity supported;During functional activity Standing balance-Leahy Scale: Fair Standing balance comment: intermittent bouts of minguard assist during static standing, overall reqiuring external assist for balance                            ADL either performed or assessed with clinical judgement   ADL Overall ADL's : Needs assistance/impaired Eating/Feeding: NPO   Grooming: Wash/dry face;Wash/dry hands;Minimal assistance;Standing Grooming Details (indicate cue type and reason): minA progressed to minguard for balance  Upper Body Bathing: Min guard;Sitting   Lower Body Bathing: Minimal assistance;Sit to/from stand   Upper Body Dressing : Min guard;Sitting   Lower Body Dressing: Sit to/from stand;Minimal assistance Lower Body Dressing Details (indicate cue type and reason): increased time/effort for donning socks, initially requiring assist (son helping) - had to doff/don new pair of socks end of session given original socks were soiled and pt able to perform on her own but with notable effort; minA standing balance  Toilet Transfer: Minimal assistance;+2 for safety/equipment;Ambulation;Regular Toilet   Toileting- Clothing Manipulation and Hygiene: Minimal assistance;Sitting/lateral lean;Sit to/from stand Toileting - Clothing Manipulation Details (indicate cue type and reason): assist for balance in standing and for clothing management; pt performing pericare      Functional mobility during ADLs: Minimal assistance;Rolling walker;+2 for safety/equipment (HHA and with  RW)       Vision Baseline Vision/History: Wears  glasses Wears Glasses: Reading only Patient Visual Report: No change from baseline Additional Comments: pt denies vision changes, no overt deficits noted, difficult to formally assess given language barrier      Perception     Praxis      Pertinent Vitals/Pain Pain Assessment: Faces Faces Pain Scale: Hurts little more Pain Location: headache Pain Descriptors / Indicators: Discomfort;Headache Pain Intervention(s): Monitored during session;Limited activity within patient's tolerance;Premedicated before session     Hand Dominance Right   Extremity/Trunk Assessment Upper Extremity Assessment Upper Extremity Assessment: RUE deficits/detail RUE Deficits / Details: RUE grossly weaker than LUE, but functional  RUE Coordination: WNL   Lower Extremity Assessment Lower Extremity Assessment: Defer to PT evaluation   Cervical / Trunk Assessment Cervical / Trunk Assessment: Kyphotic   Communication Communication Communication: Prefers language other than English (Urdu, son assisting to interpret today)   Cognition Arousal/Alertness: Awake/alert Behavior During Therapy: WFL for tasks assessed/performed;Flat affect Overall Cognitive Status: Difficult to assess                                 General Comments: following majority of commands but often requires multimodal cueing - suspect partly due to language barrier; pt requiring safety cues during ADL/mobility   General Comments  VSS, pt with loose dressing over skull incision/drain; pt with x1 instance of blood dripping from hand when mobilizing in hallway (saturated/wet dressing) and RN redressing during session     Exercises     Shoulder Instructions      Home Living Family/patient expects to be discharged to:: Private residence Living Arrangements: Children;Spouse/significant other Available Help at Discharge: Family;Available 24 hours/day Type of Home: House Home Access: Level entry     Home Layout: Two  level;Able to live on main level with bedroom/bathroom Alternate Level Stairs-Number of Steps: 15 Alternate Level Stairs-Rails: Right Bathroom Shower/Tub: Chief Strategy Officer: Standard     Home Equipment: None          Prior Functioning/Environment Level of Independence: Independent        Comments: family drives        OT Problem List: Decreased strength;Decreased range of motion;Decreased activity tolerance;Impaired balance (sitting and/or standing);Decreased cognition;Decreased safety awareness;Decreased knowledge of use of DME or AE;Pain      OT Treatment/Interventions: Self-care/ADL training;Therapeutic exercise;Energy conservation;DME and/or AE instruction;Therapeutic activities;Cognitive remediation/compensation;Patient/family education;Balance training    OT Goals(Current goals can be found in the care plan section) Acute Rehab OT Goals Patient Stated Goal: none stated, agreeagble to working with therapies  OT Goal Formulation: With patient Time For Goal Achievement: 02/02/20 Potential to Achieve Goals: Good  OT Frequency: Min 2X/week   Barriers to D/C:            Co-evaluation PT/OT/SLP Co-Evaluation/Treatment: Yes Reason for Co-Treatment: Complexity of the patient's impairments (multi-system involvement);For patient/therapist safety;To address functional/ADL transfers   OT goals addressed during session: ADL's and self-care      AM-PAC OT "6 Clicks" Daily Activity     Outcome Measure Help from another person eating meals?: A Little Help from another person taking care of personal grooming?: A Little Help from another person toileting, which includes using toliet, bedpan, or urinal?: A Little Help from another person bathing (including washing, rinsing, drying)?: A Little Help from another person to put on and taking off regular upper body clothing?: A Little  Help from another person to put on and taking off regular lower body clothing?: A  Lot 6 Click Score: 17   End of Session Equipment Utilized During Treatment: Gait belt;Rolling walker Nurse Communication: Mobility status  Activity Tolerance: Patient tolerated treatment well Patient left: in chair;with call bell/phone within reach;with chair alarm set;with family/visitor present  OT Visit Diagnosis: Unsteadiness on feet (R26.81);Other symptoms and signs involving the nervous system (R29.898);Other abnormalities of gait and mobility (R26.89)                Time: 1093-2355 OT Time Calculation (min): 45 min Charges:  OT General Charges $OT Visit: 1 Visit OT Evaluation $OT Eval Moderate Complexity: 1 Mod OT Treatments $Self Care/Home Management : 8-22 mins  Marcy Siren, OT Acute Rehabilitation Services Pager [phone] Office [phone]   Alyssa Lyons 01/19/2020, 11:35 AM

## 2020-01-19 NOTE — Progress Notes (Signed)
Neurosurgery Service Progress Note  Subjective: No acute events overnight, per family she is much more interactive / back to baseline from a cognitive standpoint, R sided strength subjectively improved   Objective: Vitals:   01/19/20 1300 01/19/20 1400 01/19/20 1500 01/19/20 1600  BP: (!) 139/104 (!) 147/76 138/86   Pulse: 68 76 66   Resp: 17 19 17    Temp:    97.8 F (36.6 C)  TempSrc:    Oral  SpO2: 98% 99% 100%    Temp (24hrs), Avg:98.3 F (36.8 C), Min:97.6 F (36.4 C), Max:99.2 F (37.3 C)  CBC Latest Ref Rng & Units 01/19/2020 01/18/2020 01/17/2020  WBC 4.0 - 10.5 K/uL 10.6(H) 13.8(H) 9.9  Hemoglobin 12.0 - 15.0 g/dL 03/18/2020 62.2 29.7  Hematocrit 36 - 46 % 39.7 43.4 44.4  Platelets 150 - 400 K/uL 127(L) 179 145(L)   BMP Latest Ref Rng & Units 01/19/2020 01/18/2020 01/17/2020  Glucose 70 - 99 mg/dL - 03/18/2020) 211(H)  BUN 8 - 23 mg/dL - 8 8  Creatinine 417(E - 1.00 mg/dL 0.81 4.48 1.85  BUN/Creat Ratio 12 - 28 - - -  Sodium 135 - 145 mmol/L - 135 138  Potassium 3.5 - 5.1 mmol/L - 4.1 3.6  Chloride 98 - 111 mmol/L - 103 104  CO2 22 - 32 mmol/L - 22 25  Calcium 8.9 - 10.3 mg/dL - 9.9 9.8    Intake/Output Summary (Last 24 hours) at 01/19/2020 1655 Last data filed at 01/19/2020 1500 Gross per 24 hour  Intake 1032.03 ml  Output 1261 ml  Net -228.97 ml    Current Facility-Administered Medications:  .  0.9 %  sodium chloride infusion, , Intravenous, PRN, 03/20/2020, MD, Stopped at 01/19/20 1404 .  acetaminophen (TYLENOL) tablet 650 mg, 650 mg, Oral, Q4H PRN **OR** acetaminophen (TYLENOL) suppository 650 mg, 650 mg, Rectal, Q4H PRN, Endya Austin, 03/20/20, MD .  Chlorhexidine Gluconate Cloth 2 % PADS 6 each, 6 each, Topical, Daily, Clovis Pu, MD, 6 each at 01/19/20 1414 .  docusate sodium (COLACE) capsule 100 mg, 100 mg, Oral, BID, Brodric Schauer A, MD, 100 mg at 01/19/20 1041 .  famotidine (PEPCID) IVPB 20 mg premix, 20 mg, Intravenous, Q12H, 03/20/20,  MD, Stopped at 01/19/20 1115 .  Glecaprevir-Pibrentasvir 100-40 MG TABS 3 tablet, 3 tablet, Oral, Q supper, Wyatt Galvan, 03/20/20, MD .  Clovis Pu ON 01/20/2020] heparin injection 5,000 Units, 5,000 Units, Subcutaneous, Q8H, Claude Swendsen A, MD .  HYDROcodone-acetaminophen (NORCO/VICODIN) 5-325 MG per tablet 1 tablet, 1 tablet, Oral, Q4H PRN, 03/21/2020, MD, 1 tablet at 01/19/20 [phone] .  HYDROmorphone (DILAUDID) injection 0.5 mg, 0.5 mg, Intravenous, Q3H PRN, 4970, MD .  labetalol (NORMODYNE) injection 10-40 mg, 10-40 mg, Intravenous, Q10 min PRN, 10-06-1996, MD .  levothyroxine (SYNTHROID) tablet 75 mcg, 75 mcg, Oral, Q0600, Jadene Pierini, MD, 75 mcg at 01/19/20 0615 .  losartan (COZAAR) tablet 50 mg, 50 mg, Oral, Daily, Althia Egolf, 03/20/20, MD, 50 mg at 01/19/20 1042 .  ondansetron (ZOFRAN) tablet 4 mg, 4 mg, Oral, Q4H PRN **OR** ondansetron (ZOFRAN) injection 4 mg, 4 mg, Intravenous, Q4H PRN, Brian Kocourek A, MD .  polyethylene glycol (MIRALAX / GLYCOLAX) packet 17 g, 17 g, Oral, Daily PRN, 03/20/20, MD .  promethazine (PHENERGAN) tablet 12.5-25 mg, 12.5-25 mg, Oral, Q4H PRN, Parminder Cupples A, MD .  sodium chloride flush (NS) 0.9 % injection 3 mL, 3 mL, Intravenous, Once, 05-16-1984,  MD   Physical Exam: AOx3, PERRL, EOMI, FS, Strength 5/5 on L, 4+/5 on R, SILTx4 Incision c/d/i, drain w/ chronic blood products  Assessment & Plan: 64 y.o. woman w/ R sided weakness and AMS, CTH w/ L > R SDH s/p burr hole evacuation of symptomatic L sided SDH, recovering well.  -cont drain, likely will d/c drains tomorrow and transfer out of the ICU -hold SQH until POD3, pt has hepatic coagulopathy, benefits likely outweigh risks -activity / diet as tolerated  Jadene Pierini  01/19/20 4:55 PM

## 2020-01-20 ENCOUNTER — Inpatient Hospital Stay (HOSPITAL_COMMUNITY): Payer: 59

## 2020-01-20 DIAGNOSIS — M79609 Pain in unspecified limb: Secondary | ICD-10-CM

## 2020-01-20 DIAGNOSIS — M7989 Other specified soft tissue disorders: Secondary | ICD-10-CM

## 2020-01-20 MED ORDER — LIDOCAINE HCL (PF) 1 % IJ SOLN
INTRAMUSCULAR | Status: AC
  Filled 2020-01-20: qty 30

## 2020-01-20 MED ORDER — FAMOTIDINE 20 MG PO TABS
20.0000 mg | ORAL_TABLET | Freq: Two times a day (BID) | ORAL | Status: DC
  Administered 2020-01-20 – 2020-01-21 (×2): 20 mg via ORAL
  Filled 2020-01-20 (×2): qty 1

## 2020-01-20 NOTE — Progress Notes (Signed)
Physical Therapy Treatment Patient Details Name: Alyssa Lyons MRN: [id number] DOB: 11/11/1955 Today's Date: 01/20/2020    History of Present Illness Pt is a 64 y/o female who presented with R side weakness and confusion. CT head shows large L and smaller R mixed density SDHs with brain compression. Pt is now s/p L burr hole craniotomy on 10/4. PMHx includes hepatitis C, HTN     PT Comments    Pt with L LE pain today but improved overall ambulation and walker management. Pt with increased ambulation tolerance despite L LE pain. Pt fatigues quickly and demo's increased trunk flexion requiring verbal cues to stand up right. RN notified about L lower leg pain. Acute PT to cont to follow.    Follow Up Recommendations  Home health PT;Supervision/Assistance - 24 hour     Equipment Recommendations  Rolling walker with 5" wheels    Recommendations for Other Services       Precautions / Restrictions Precautions Precautions: Fall Restrictions Weight Bearing Restrictions: No    Mobility  Bed Mobility Overal bed mobility: Needs Assistance Bed Mobility: Sit to Supine       Sit to supine: Min assist   General bed mobility comments: minA for LE management back into bed, worked on transfering into bed with HOB flat to mimic home, max directional verbal cues  Transfers Overall transfer level: Needs assistance Equipment used: Rolling walker (2 wheeled) Transfers: Sit to/from Stand Sit to Stand: Min guard         General transfer comment: verbal cues for hand placement  Ambulation/Gait Ambulation/Gait assistance: Min assist;+2 safety/equipment Gait Distance (Feet): 120 Feet Assistive device: Rolling walker (2 wheeled) Gait Pattern/deviations: Step-to pattern;Decreased stride length;Decreased step length - right;Decreased weight shift to left;Decreased stance time - left;Antalgic Gait velocity: slow (3 standing rest breaks) Gait velocity interpretation: <1.31 ft/sec, indicative  of household ambulator General Gait Details: pt initially with step to, antalgic, gait pattern due to L LE pain, pt progressed to step through pattern though still short step length, pt much better with walker management today and not vearing to the L like yesterday, verbal cues to hold head up   Stairs             Wheelchair Mobility    Modified Rankin (Stroke Patients Only) Modified Rankin (Stroke Patients Only) Pre-Morbid Rankin Score: No symptoms Modified Rankin: Moderate disability     Balance Overall balance assessment: Needs assistance Sitting-balance support: Feet supported Sitting balance-Leahy Scale: Fair Sitting balance - Comments: able to maintain static balance without assist, posterior bias when completing LE MMT Postural control: Posterior lean Standing balance support: Single extremity supported;Bilateral upper extremity supported;No upper extremity supported;During functional activity Standing balance-Leahy Scale: Fair                              Cognition Arousal/Alertness: Awake/alert Behavior During Therapy: Impulsive Overall Cognitive Status: Impaired/Different from baseline Area of Impairment: Safety/judgement                         Safety/Judgement: Decreased awareness of deficits;Decreased awareness of safety     General Comments: pt quick to move, decreased safety awareness      Exercises      General Comments General comments (skin integrity, edema, etc.): VSS      Pertinent Vitals/Pain Pain Assessment: Faces Faces Pain Scale: Hurts little more Pain Location: R foot feels like there is ice  on it, pain from L knee down to foot Pain Descriptors / Indicators: Discomfort Pain Intervention(s): Monitored during session    Home Living                      Prior Function            PT Goals (current goals can now be found in the care plan section) Progress towards PT goals: Progressing toward goals     Frequency    Min 4X/week      PT Plan Current plan remains appropriate    Co-evaluation              AM-PAC PT "6 Clicks" Mobility   Outcome Measure  Help needed turning from your back to your side while in a flat bed without using bedrails?: A Little Help needed moving from lying on your back to sitting on the side of a flat bed without using bedrails?: A Little Help needed moving to and from a bed to a chair (including a wheelchair)?: A Little Help needed standing up from a chair using your arms (e.g., wheelchair or bedside chair)?: A Little Help needed to walk in hospital room?: A Lot Help needed climbing 3-5 steps with a railing? : A Lot 6 Click Score: 16    End of Session Equipment Utilized During Treatment: Gait belt Activity Tolerance: Patient tolerated treatment well Patient left: in bed;with call bell/phone within reach;with bed alarm set;with family/visitor present Nurse Communication: Mobility status PT Visit Diagnosis: Unsteadiness on feet (R26.81);Difficulty in walking, not elsewhere classified (R26.2)     Time: 2423-5361 PT Time Calculation (min) (ACUTE ONLY): 25 min  Charges:  $Gait Training: 23-37 mins                     Lewis Shock, PT, DPT Acute Rehabilitation Services Pager #: [phone] Office #: [phone]    Iona Hansen 01/20/2020, 1:39 PM

## 2020-01-20 NOTE — Progress Notes (Signed)
Left lower extremity venous duplex completed. Refer to "CV Proc" under chart review to view preliminary results.  01/20/2020 6:44 PM Eula Fried., MHA, RVT, RDCS, RDMS

## 2020-01-20 NOTE — Plan of Care (Signed)
  Problem: Clinical Measurements: Goal: Ability to maintain intracranial pressure will improve Outcome: Progressing   Problem: Skin Integrity: Goal: Demonstration of wound healing without infection will improve Outcome: Progressing   Problem: Education: Goal: Knowledge of General Education information will improve Description: Including pain rating scale, medication(s)/side effects and non-pharmacologic comfort measures Outcome: Progressing   Problem: Clinical Measurements: Goal: Will remain free from infection Outcome: Progressing Goal: Diagnostic test results will improve Outcome: Progressing   Problem: Activity: Goal: Risk for activity intolerance will decrease Outcome: Progressing   Problem: Nutrition: Goal: Adequate nutrition will be maintained Outcome: Progressing   Problem: Elimination: Goal: Will not experience complications related to urinary retention Outcome: Progressing   Problem: Pain Managment: Goal: General experience of comfort will improve Outcome: Progressing   Problem: Safety: Goal: Ability to remain free from injury will improve Outcome: Progressing   Problem: Skin Integrity: Goal: Risk for impaired skin integrity will decrease Outcome: Progressing

## 2020-01-20 NOTE — Progress Notes (Addendum)
Occupational Therapy Treatment Patient Details Name: Alyssa Lyons MRN: [id number] DOB: 06-11-1955 Today's Date: 01/20/2020    History of present illness Pt is a 64 y/o female who presented with R side weakness and confusion. CT head shows large L and smaller R mixed density SDHs with brain compression. Pt is now s/p L burr hole craniotomy on 10/4. PMHx includes hepatitis C, HTN    OT comments  Pt with slow progress towards OT goals, limited by c/o pain in LEs and back as well as reports of numbness in LLE. Pt with decreased safety awareness and impulsive to move. She requires frequent cues for safety throughout. Pt's niece present and interpreting throughout. Pt performing multiple ADL during session including toileting, standing grooming ADL, requires up to modA for ADL and mobility at this time including assist/cues for safe use of RW. Pt requiring increased time/effort throughout. VSS. Continue to recommend [facility] services after discharge and recommend she have hands on 24hr supervision/assist to ensure safety with ADL/mobility after return home.    Follow Up Recommendations  Home health OT;Supervision/Assistance - 24 hour    Equipment Recommendations  Tub/shower seat          Precautions / Restrictions Precautions Precautions: Fall Restrictions Weight Bearing Restrictions: No       Mobility Bed Mobility Overal bed mobility: Needs Assistance Bed Mobility: Supine to Sit     Supine to sit: Min guard Sit to supine: Min assist   General bed mobility comments: for lines and safety  Transfers Overall transfer level: Needs assistance Equipment used: Rolling walker (2 wheeled) Transfers: Sit to/from Stand Sit to Stand: Min guard;Min assist         General transfer comment: verbal cues for hand placement; flucuating minguard - minA for stability. VCs for safety     Balance Overall balance assessment: Needs assistance Sitting-balance support: Feet supported Sitting  balance-Leahy Scale: Fair Sitting balance - Comments: able to maintain static balance without assist, posterior bias when completing LE MMT Postural control: Posterior lean Standing balance support: Single extremity supported;Bilateral upper extremity supported;No upper extremity supported;During functional activity Standing balance-Leahy Scale: Poor Standing balance comment: occasional static standing with close minguard. often requiring at least minA for balance                            ADL either performed or assessed with clinical judgement   ADL Overall ADL's : Needs assistance/impaired     Grooming: Wash/dry face;Wash/dry hands;Oral care;Minimal assistance;Standing Grooming Details (indicate cue type and reason): assist for balance, prolonged time standing at sink - progressed to sitting given back pain (first time OOB today) Upper Body Bathing: Minimal assistance;Sitting Upper Body Bathing Details (indicate cue type and reason): pt's niece assisting seated in recliner      Upper Body Dressing : Minimal assistance;Sitting Upper Body Dressing Details (indicate cue type and reason): donning new gown      Toilet Transfer: Moderate assistance;Ambulation Toilet Transfer Details (indicate cue type and reason): max cues/assist for RW management Toileting- Clothing Manipulation and Hygiene: Moderate assistance;Sitting/lateral lean;Sit to/from stand Toileting - Clothing Manipulation Details (indicate cue type and reason): assisting with pericare after BM to ensure thoroughness, pt also performing pericare with additional assist gown management and balance in standing      Functional mobility during ADLs: Moderate assistance;Rolling walker General ADL Comments: pt requiring increased time/assist for ADL and mobility tasks today. pt stiff with mobility (this was her first time getting  OOB today)                        Cognition Arousal/Alertness:  Awake/alert Behavior During Therapy: Impulsive Overall Cognitive Status: Impaired/Different from baseline Area of Impairment: Safety/judgement                         Safety/Judgement: Decreased awareness of deficits;Decreased awareness of safety     General Comments: pt quick to move, decreased safety awareness. attempted to stand multiple times without OT assist         Exercises     Shoulder Instructions       General Comments VSS    Pertinent Vitals/ Pain       Pain Assessment: Faces Faces Pain Scale: Hurts even more Pain Location: R foot feels like there is ice on it, pain from L knee down to foot; back with mobility Pain Descriptors / Indicators: Discomfort;Grimacing;Guarding;Sore Pain Intervention(s): Monitored during session;Repositioned;Limited activity within patient's tolerance  Home Living                                          Prior Functioning/Environment              Frequency  Min 2X/week        Progress Toward Goals  OT Goals(current goals can now be found in the care plan section)  Progress towards OT goals: OT to reassess next treatment  Acute Rehab OT Goals Patient Stated Goal: none stated, agreeagble to working with therapies  OT Goal Formulation: With patient Time For Goal Achievement: 02/02/20 Potential to Achieve Goals: Good ADL Goals Pt Will Perform Grooming: with supervision;standing Pt Will Perform Lower Body Bathing: with supervision;sit to/from stand Pt Will Perform Lower Body Dressing: with supervision;sit to/from stand Pt Will Transfer to Toilet: with supervision;ambulating Pt Will Perform Toileting - Clothing Manipulation and hygiene: with supervision;sit to/from stand  Plan Discharge plan remains appropriate    Co-evaluation                 AM-PAC OT "6 Clicks" Daily Activity     Outcome Measure   Help from another person eating meals?: A Little Help from another person taking  care of personal grooming?: A Little Help from another person toileting, which includes using toliet, bedpan, or urinal?: A Lot Help from another person bathing (including washing, rinsing, drying)?: A Lot Help from another person to put on and taking off regular upper body clothing?: A Little Help from another person to put on and taking off regular lower body clothing?: A Lot 6 Click Score: 15    End of Session Equipment Utilized During Treatment: Gait belt;Rolling walker  OT Visit Diagnosis: Unsteadiness on feet (R26.81);Other symptoms and signs involving the nervous system (R29.898);Other abnormalities of gait and mobility (R26.89)   Activity Tolerance Patient tolerated treatment well   Patient Left in chair;with call bell/phone within reach;with chair alarm set;with family/visitor present   Nurse Communication Mobility status        Time: 9794-8016 OT Time Calculation (min): 63 min  Charges: OT General Charges $OT Visit: 1 Visit OT Treatments $Self Care/Home Management : 53-67 mins  Marcy Siren, OT Acute Rehabilitation Services Pager [phone] Office [phone]    Orlando Penner 01/20/2020, 3:26 PM

## 2020-01-20 NOTE — Progress Notes (Signed)
Neurosurgery Service Progress Note  Subjective: No acute events overnight  Objective: Vitals:   01/20/20 0200 01/20/20 0300 01/20/20 0400 01/20/20 0500  BP:  129/70 122/70 121/66  Pulse: 84 77 74 66  Resp: 20 20 (!) 28 18  Temp:   98.6 F (37 C)   TempSrc:   Oral   SpO2: 98% 99% 99% 99%   Temp (24hrs), Avg:98.2 F (36.8 C), Min:97.6 F (36.4 C), Max:98.6 F (37 C)  CBC Latest Ref Rng & Units 01/19/2020 01/18/2020 01/17/2020  WBC 4.0 - 10.5 K/uL 10.6(H) 13.8(H) 9.9  Hemoglobin 12.0 - 15.0 g/dL 82.5 05.3 97.6  Hematocrit 36 - 46 % 39.7 43.4 44.4  Platelets 150 - 400 K/uL 127(L) 179 145(L)   BMP Latest Ref Rng & Units 01/19/2020 01/18/2020 01/17/2020  Glucose 70 - 99 mg/dL - 734(L) 937(T)  BUN 8 - 23 mg/dL - 8 8  Creatinine 0.24 - 1.00 mg/dL 0.97 3.53 2.99  BUN/Creat Ratio 12 - 28 - - -  Sodium 135 - 145 mmol/L - 135 138  Potassium 3.5 - 5.1 mmol/L - 4.1 3.6  Chloride 98 - 111 mmol/L - 103 104  CO2 22 - 32 mmol/L - 22 25  Calcium 8.9 - 10.3 mg/dL - 9.9 9.8    Intake/Output Summary (Last 24 hours) at 01/20/2020 0930 Last data filed at 01/20/2020 0600 Gross per 24 hour  Intake 332.2 ml  Output 563 ml  Net -230.8 ml    Current Facility-Administered Medications:  .  0.9 %  sodium chloride infusion, , Intravenous, PRN, Jadene Pierini, MD, Stopped at 01/19/20 1404 .  acetaminophen (TYLENOL) tablet 650 mg, 650 mg, Oral, Q4H PRN **OR** acetaminophen (TYLENOL) suppository 650 mg, 650 mg, Rectal, Q4H PRN, Daysi Boggan, Clovis Pu, MD .  Chlorhexidine Gluconate Cloth 2 % PADS 6 each, 6 each, Topical, Daily, Jadene Pierini, MD, 6 each at 01/19/20 1414 .  docusate sodium (COLACE) capsule 100 mg, 100 mg, Oral, BID, Jadene Pierini, MD, 100 mg at 01/19/20 2148 .  famotidine (PEPCID) IVPB 20 mg premix, 20 mg, Intravenous, Q12H, Jadene Pierini, MD, Stopped at 01/19/20 2220 .  Glecaprevir-Pibrentasvir 100-40 MG TABS 3 tablet, 3 tablet, Oral, Q supper, Jadene Pierini, MD, 3  tablet at 01/19/20 1803 .  [START ON 01/21/2020] heparin injection 5,000 Units, 5,000 Units, Subcutaneous, Q8H, Kendal Ghazarian A, MD .  HYDROcodone-acetaminophen (NORCO/VICODIN) 5-325 MG per tablet 1 tablet, 1 tablet, Oral, Q4H PRN, Jadene Pierini, MD, 1 tablet at 01/19/20 [phone] .  HYDROmorphone (DILAUDID) injection 0.5 mg, 0.5 mg, Intravenous, Q3H PRN, Jadene Pierini, MD .  labetalol (NORMODYNE) injection 10-40 mg, 10-40 mg, Intravenous, Q10 min PRN, Jadene Pierini, MD .  levothyroxine (SYNTHROID) tablet 75 mcg, 75 mcg, Oral, Q0600, Jadene Pierini, MD, 75 mcg at 01/20/20 0557 .  lidocaine (PF) (XYLOCAINE) 1 % injection, , , ,  .  losartan (COZAAR) tablet 50 mg, 50 mg, Oral, Daily, Darlena Koval A, MD, 50 mg at 01/19/20 1042 .  ondansetron (ZOFRAN) tablet 4 mg, 4 mg, Oral, Q4H PRN **OR** ondansetron (ZOFRAN) injection 4 mg, 4 mg, Intravenous, Q4H PRN, Malva Diesing A, MD .  polyethylene glycol (MIRALAX / GLYCOLAX) packet 17 g, 17 g, Oral, Daily PRN, Jadene Pierini, MD .  promethazine (PHENERGAN) tablet 12.5-25 mg, 12.5-25 mg, Oral, Q4H PRN, Denali Becvar A, MD .  sodium chloride flush (NS) 0.9 % injection 3 mL, 3 mL, Intravenous, Once, Eber Hong, MD   Physical Exam:  AOx3, PERRL, EOMI, FS, Strength 5/5 on L, 5/5 on R, SILTx4 Incision c/d/i, drain removed and and drain site stitched in sterile fashion on rounds  Assessment & Plan: 64 y.o. woman w/ R sided weakness and AMS, CTH w/ L > R SDH s/p burr hole evacuation of symptomatic L sided SDH, recovering well.  -drain d/c'd on rounds -SQH POD3, pt has hepatic coagulopathy, benefits likely outweigh risks -activity / diet as tolerated -possible discharge tomorrow with home PT -okay to eat / drink food/tea from family  Jadene Pierini  01/20/20 9:30 AM

## 2020-01-21 ENCOUNTER — Telehealth: Payer: Self-pay

## 2020-01-21 MED ORDER — HYDROCODONE-ACETAMINOPHEN 5-325 MG PO TABS
1.0000 | ORAL_TABLET | Freq: Four times a day (QID) | ORAL | 0 refills | Status: DC | PRN
Start: 2020-01-21 — End: 2020-12-24

## 2020-01-21 NOTE — Discharge Instructions (Signed)
Discharge Instructions  No restriction in activities, slowly increase your activity back to normal.   Your incision is closed with absorbable sutures. These will naturally fall off over the next 4-6 weeks. If they become bothersome or cause discomfort, apply some antibiotic ointment like bacitracin or neosporin on the sutures. This will soften them up and usually makes them more comfortable while they dissolve.   Be gentle when cleaning your incision. Use regular soap and water. If that is uncomfortable, try using baby shampoo. Do not submerge the wound under water for 2 weeks after surgery.  Follow up with Dr. Maurice Small in 2-3 weeks after discharge. If you do not already have a discharge appointment, please call his office at [phone] to schedule a follow up appointment. If you have any concerns or questions, please call the office and let us know.

## 2020-01-21 NOTE — Telephone Encounter (Signed)
[facility] Patient Advocate Encounter  Completed and sent MYABBVIE application for MAVYRET for this patient who is insured.  Script was mailed on 01/15/20 and the second one will be mailed on 02/05/20 directly from [facility].  Clearance Coots, CPhT Specialty Pharmacy Patient [facility] Phone: [phone] Fax:  [phone]

## 2020-01-21 NOTE — TOC Transition Note (Signed)
Transition of Care [facility]) - CM/SW Discharge Note   Patient Details  Name: Alyssa Lyons MRN: [id number] Date of Birth: 1956-02-24  Transition of Care [facility]) CM/SW Contact:  Pollie Friar, RN Phone Number: 01/21/2020, 10:37 AM   Clinical Narrative:    CM met with the patient and her son at the bedside. Pt has 24 hour supervision per son. HH arranged through [facility]. Tommi Rumps with [facility] accepted the referral.  [employer] will deliver the DME to the room for home.  PCP: Dr Lysle Rubens with [facility] Pt has transportation home.    Final next level of care: Home w Home Health Services Barriers to Discharge: No Barriers Identified   Patient Goals and CMS Choice   CMS Medicare.gov Compare Post Acute Care list provided to:: Patient Represenative (must comment) Choice offered to / list presented to : Adult Children  Discharge Placement                       Discharge Plan and Services                DME Arranged: 3-N-1, Walker rolling DME Agency: [employer] Date DME Agency Contacted: 01/21/20   Representative spoke with at DME Agency: Peggye Form metal [place]: PT, OT [place] Agency: [place] Date [place]: 01/21/20   Representative spoke with at [place]: [place] ([place]) Interventions     Readmission Risk Interventions No flowsheet data found.

## 2020-01-21 NOTE — Plan of Care (Signed)
  Problem: Education: Goal: Knowledge of the prescribed therapeutic regimen will improve Outcome: Adequate for Discharge   Problem: Clinical Measurements: Goal: Ability to maintain intracranial pressure will improve Outcome: Adequate for Discharge   Problem: Skin Integrity: Goal: Demonstration of wound healing without infection will improve Outcome: Adequate for Discharge   Problem: Education: Goal: Knowledge of General Education information will improve Description: Including pain rating scale, medication(s)/side effects and non-pharmacologic comfort measures Outcome: Adequate for Discharge   Problem: Health Behavior/Discharge Planning: Goal: Ability to manage health-related needs will improve Outcome: Adequate for Discharge   Problem: Clinical Measurements: Goal: Ability to maintain clinical measurements within normal limits will improve Outcome: Adequate for Discharge Goal: Will remain free from infection Outcome: Adequate for Discharge Goal: Diagnostic test results will improve Outcome: Adequate for Discharge Goal: Respiratory complications will improve Outcome: Adequate for Discharge Goal: Cardiovascular complication will be avoided Outcome: Adequate for Discharge   Problem: Activity: Goal: Risk for activity intolerance will decrease Outcome: Adequate for Discharge   Problem: Nutrition: Goal: Adequate nutrition will be maintained Outcome: Adequate for Discharge   Problem: Coping: Goal: Level of anxiety will decrease Outcome: Adequate for Discharge   Problem: Elimination: Goal: Will not experience complications related to bowel motility Outcome: Adequate for Discharge Goal: Will not experience complications related to urinary retention Outcome: Adequate for Discharge   Problem: Pain Managment: Goal: General experience of comfort will improve Outcome: Adequate for Discharge   Problem: Safety: Goal: Ability to remain free from injury will improve Outcome:  Adequate for Discharge   Problem: Skin Integrity: Goal: Risk for impaired skin integrity will decrease Outcome: Adequate for Discharge

## 2020-01-21 NOTE — Progress Notes (Signed)
Physical Therapy Treatment Patient Details Name: Alyssa Lyons MRN: [id number] DOB: 06-19-1955 Today's Date: 01/21/2020    History of Present Illness Pt is a 64 y/o female who presented with R side weakness and confusion. CT head shows large L and smaller R mixed density SDHs with brain compression. Pt is now s/p L burr hole craniotomy on 10/4. PMHx includes hepatitis C, HTN     PT Comments    Pt making good progress with improved safety.  Pt with good family support and will be able to stay on the first level of her home.  Continue to progress mobility and recommend HHPT to advance.   Follow Up Recommendations  Home health PT;Supervision/Assistance - 24 hour     Equipment Recommendations  Rolling walker with 5" wheels    Recommendations for Other Services       Precautions / Restrictions Precautions Precautions: Fall    Mobility  Bed Mobility Overal bed mobility: Needs Assistance Bed Mobility: Supine to Sit     Supine to sit: Min guard     General bed mobility comments: increased time  Transfers Overall transfer level: Needs assistance Equipment used: 1 person hand held assist Transfers: Sit to/from Stand Sit to Stand: Min guard         General transfer comment: performed x 3; min guard for safety  Ambulation/Gait Ambulation/Gait assistance: Min assist Gait Distance (Feet): 150 Feet Assistive device: 2 person hand held assist Gait Pattern/deviations: Decreased weight shift to right;Decreased stance time - right;Decreased stride length     General Gait Details: Pt initially with step to L LE with decreased stride length R LE - able to correct with verbal and visual cues; cued for increased heel strike; also facilitation to increase speed and reciprocal pattern; preferred to try HHA over RW   Stairs             Wheelchair Mobility    Modified Rankin (Stroke Patients Only) Modified Rankin (Stroke Patients Only) Pre-Morbid Rankin Score: No  symptoms Modified Rankin: Moderate disability     Balance Overall balance assessment: Needs assistance Sitting-balance support: Feet supported Sitting balance-Leahy Scale: Good     Standing balance support: Single extremity supported Standing balance-Leahy Scale: Poor Standing balance comment: requiring use of single limb or leaning on counter                            Cognition Arousal/Alertness: Awake/alert Behavior During Therapy: WFL for tasks assessed/performed Overall Cognitive Status: Within Functional Limits for tasks assessed                                 General Comments: followed commands, no impulsiveness today      Exercises      General Comments General comments (skin integrity, edema, etc.): VSS; educated on home safety; pt was able to perform toielting ADLs with min guard for balance in standing      Pertinent Vitals/Pain Pain Assessment: Faces Faces Pain Scale: Hurts little more Pain Location: back Pain Descriptors / Indicators: Sore Pain Intervention(s): Limited activity within patient's tolerance;Monitored during session;Repositioned    Home Living                      Prior Function            PT Goals (current goals can now be found in the care plan  section) Progress towards PT goals: Progressing toward goals    Frequency    Min 4X/week      PT Plan Current plan remains appropriate    Co-evaluation              AM-PAC PT "6 Clicks" Mobility   Outcome Measure  Help needed turning from your back to your side while in a flat bed without using bedrails?: A Little Help needed moving from lying on your back to sitting on the side of a flat bed without using bedrails?: A Little Help needed moving to and from a bed to a chair (including a wheelchair)?: A Little Help needed standing up from a chair using your arms (e.g., wheelchair or bedside chair)?: A Little Help needed to walk in hospital  room?: A Little Help needed climbing 3-5 steps with a railing? : A Lot 6 Click Score: 17    End of Session Equipment Utilized During Treatment: Gait belt Activity Tolerance: Patient tolerated treatment well Patient left: in bed;with call bell/phone within reach;with family/visitor present Nurse Communication: Mobility status PT Visit Diagnosis: Unsteadiness on feet (R26.81);Difficulty in walking, not elsewhere classified (R26.2)     Time: 1140-1200 PT Time Calculation (min) (ACUTE ONLY): 20 min  Charges:  $Gait Training: 8-22 mins                     Alyssa Lyons, PT Acute Rehab Services Pager [phone] [facility] [phone]     Alyssa Lyons 01/21/2020, 12:07 PM

## 2020-01-21 NOTE — Progress Notes (Signed)
Neurosurgery Service Progress Note  Subjective: No acute events overnight  Objective: Vitals:   01/21/20 0000 01/21/20 0100 01/21/20 0200 01/21/20 0312  BP: 133/70 101/66 (!) 112/59 (!) 149/86  Pulse: 66 65 65 71  Resp: 19 19 (!) 30 16  Temp: 98.1 F (36.7 C)   98.2 F (36.8 C)  TempSrc: Oral   Oral  SpO2: 98% 99% 98% 100%   Temp (24hrs), Avg:98.5 F (36.9 C), Min:98.1 F (36.7 C), Max:99.4 F (37.4 C)  CBC Latest Ref Rng & Units 01/19/2020 01/18/2020 01/17/2020  WBC 4.0 - 10.5 K/uL 10.6(H) 13.8(H) 9.9  Hemoglobin 12.0 - 15.0 g/dL 51.7 61.6 07.3  Hematocrit 36 - 46 % 39.7 43.4 44.4  Platelets 150 - 400 K/uL 127(L) 179 145(L)   BMP Latest Ref Rng & Units 01/19/2020 01/18/2020 01/17/2020  Glucose 70 - 99 mg/dL - 710(G) 269(S)  BUN 8 - 23 mg/dL - 8 8  Creatinine 8.54 - 1.00 mg/dL 6.27 0.35 0.09  BUN/Creat Ratio 12 - 28 - - -  Sodium 135 - 145 mmol/L - 135 138  Potassium 3.5 - 5.1 mmol/L - 4.1 3.6  Chloride 98 - 111 mmol/L - 103 104  CO2 22 - 32 mmol/L - 22 25  Calcium 8.9 - 10.3 mg/dL - 9.9 9.8    Intake/Output Summary (Last 24 hours) at 01/21/2020 0750 Last data filed at 01/20/2020 2000 Gross per 24 hour  Intake 60 ml  Output --  Net 60 ml    Current Facility-Administered Medications:    0.9 %  sodium chloride infusion, , Intravenous, PRN, Jadene Pierini, MD, Stopped at 01/19/20 1404   acetaminophen (TYLENOL) tablet 650 mg, 650 mg, Oral, Q4H PRN **OR** acetaminophen (TYLENOL) suppository 650 mg, 650 mg, Rectal, Q4H PRN, Dajae Kizer, Clovis Pu, MD   Chlorhexidine Gluconate Cloth 2 % PADS 6 each, 6 each, Topical, Daily, Montana Bryngelson, Clovis Pu, MD, 6 each at 01/20/20 1156   docusate sodium (COLACE) capsule 100 mg, 100 mg, Oral, BID, Jadene Pierini, MD, 100 mg at 01/20/20 2108   famotidine (PEPCID) tablet 20 mg, 20 mg, Oral, BID, Scarlett Presto, RPH, 20 mg at 01/20/20 2108   Glecaprevir-Pibrentasvir 100-40 MG TABS 3 tablet, 3 tablet, Oral, Q supper, Jadene Pierini,  MD, 3 tablet at 01/20/20 1727   heparin injection 5,000 Units, 5,000 Units, Subcutaneous, Q8H, Aylla Huffine, Clovis Pu, MD, 5,000 Units at 01/21/20 0650   HYDROcodone-acetaminophen (NORCO/VICODIN) 5-325 MG per tablet 1 tablet, 1 tablet, Oral, Q4H PRN, Jadene Pierini, MD, 1 tablet at 01/20/20 2108   HYDROmorphone (DILAUDID) injection 0.5 mg, 0.5 mg, Intravenous, Q3H PRN, Jadene Pierini, MD   labetalol (NORMODYNE) injection 10-40 mg, 10-40 mg, Intravenous, Q10 min PRN, Jadene Pierini, MD   levothyroxine (SYNTHROID) tablet 75 mcg, 75 mcg, Oral, Q0600, Jadene Pierini, MD, 75 mcg at 01/21/20 0650   losartan (COZAAR) tablet 50 mg, 50 mg, Oral, Daily, Traye Bates A, MD, 50 mg at 01/20/20 1025   ondansetron (ZOFRAN) tablet 4 mg, 4 mg, Oral, Q4H PRN **OR** ondansetron (ZOFRAN) injection 4 mg, 4 mg, Intravenous, Q4H PRN, Portia Wisdom A, MD   polyethylene glycol (MIRALAX / GLYCOLAX) packet 17 g, 17 g, Oral, Daily PRN, Jadene Pierini, MD   promethazine (PHENERGAN) tablet 12.5-25 mg, 12.5-25 mg, Oral, Q4H PRN, Nijae Doyel A, MD   sodium chloride flush (NS) 0.9 % injection 3 mL, 3 mL, Intravenous, Once, Eber Hong, MD   Physical Exam: Awake/alert and interactive, PERRL, EOMI, FS,  Strength 5/5 on L, 5/5 on R, SILTx4 Incision c/d/i  Assessment & Plan: 64 y.o. woman w/ R sided weakness and AMS, CTH w/ L > R SDH s/p burr hole evacuation of symptomatic L sided SDH, 10/5 post-op CTH w/ good evacuation of L SDH, stable R SDH w/ substantial clinical improvement post-op, 10/6 subdural drain d/c'd.   -SQH POD3 if not discharged today -having some RLE pain, on examination today, appears more consistent with radicular pain. DVT U/S negative for DVT -activity / diet as tolerated -discharge home today with home PT -okay to eat / drink food/tea from family  Jadene Pierini  01/21/20 7:50 AM

## 2020-01-21 NOTE — Care Plan (Signed)
Notified patient's sister, Particia Lather, that patient is moving to [phone].

## 2020-01-21 NOTE — Discharge Summary (Addendum)
Physician Discharge Summary  Patient ID: Alyssa Lyons MRN: [id number] DOB/AGE: [birth year] 64 y.o.  Admit date: 01/18/2020 Discharge date: 01/21/2020  Admission Diagnoses: left subdural hematoma  Discharge Diagnoses:  Active Problems:   Subdural hematoma (HCC)   History of burr hole surgery   Discharged Condition: good  Hospital Course: The patient presented with right sided weakness and confusion, CTH showed left greater than right subdural hematomas. She was taken to the OR on 01/18/20 for left burr hole evacuation for subdural hematoma. The patient tolerated the procedure well. The patient taken to the recovery room and then to the floor in stable condition. The hospital course was routine. There was no complications.  Drain removed 01/20/2020. She is much more interactive from cognitive standpoint. Right upper strength is significantly improved.   The wound remains clean, dry and intact. The patient remained afebrile with stable vital signs, and tolerated a regular diet. The pain is well controlled with oral pain medications. She is scheduled for follow-up with Dr. Maurice Small in the office in 2-3 weeks. I notified the family about discharging patient. Patient is discharging home with home health.   Consults: None  Discharge Exam: Blood pressure (!) 149/86, pulse 71, temperature 98.2 F (36.8 C), temperature source Oral, resp. rate 16, SpO2 100 %. Alert and oriented x3, follow commands, strength 5/5 on L, 4/5 on R. Incision clean, dry and intact. MAE. Disposition: Discharge disposition: 01-Home or Self Care       Discharge Instructions    Home Health   Complete by: As directed    To provide the following care/treatments:  PT OT       Allergies as of 01/21/2020   No Known Allergies     Medication List    TAKE these medications   gabapentin 100 MG capsule Commonly known as: NEURONTIN Take 100 mg by mouth 2 (two) times daily.   HYDROcodone-acetaminophen 5-325 MG  tablet Commonly known as: Norco Take 1 tablet by mouth every 6 (six) hours as needed for moderate pain.   levothyroxine 75 MCG tablet Commonly known as: SYNTHROID Take 1 tablet (75 mcg total) by mouth daily before breakfast.   losartan 50 MG tablet Commonly known as: COZAAR Take 1 tablet (50 mg total) by mouth daily.   Mavyret 100-40 MG Tabs Generic drug: Glecaprevir-Pibrentasvir Take 3 tablets by mouth daily with breakfast. What changed: when to take this   omeprazole 20 MG capsule Commonly known as: PRILOSEC Take 20 mg by mouth daily.        Signed: Kennon Portela 01/21/2020, 10:03 AM

## 2020-01-23 LAB — CULTURE, BLOOD (ROUTINE X 2)
Culture: NO GROWTH
Culture: NO GROWTH
Special Requests: ADEQUATE
Special Requests: ADEQUATE

## 2020-02-03 ENCOUNTER — Ambulatory Visit (INDEPENDENT_AMBULATORY_CARE_PROVIDER_SITE_OTHER): Payer: 59 | Admitting: Pulmonary Disease

## 2020-02-03 ENCOUNTER — Ambulatory Visit (INDEPENDENT_AMBULATORY_CARE_PROVIDER_SITE_OTHER): Payer: 59 | Admitting: Pharmacist

## 2020-02-03 ENCOUNTER — Encounter: Payer: Self-pay | Admitting: Pulmonary Disease

## 2020-02-03 ENCOUNTER — Other Ambulatory Visit: Payer: Self-pay

## 2020-02-03 VITALS — BP 148/80 | HR 78 | Temp 97.5°F | Ht 58.47 in | Wt 144.4 lb

## 2020-02-03 DIAGNOSIS — R0600 Dyspnea, unspecified: Secondary | ICD-10-CM | POA: Diagnosis not present

## 2020-02-03 DIAGNOSIS — R768 Other specified abnormal immunological findings in serum: Secondary | ICD-10-CM | POA: Diagnosis not present

## 2020-02-03 DIAGNOSIS — R042 Hemoptysis: Secondary | ICD-10-CM

## 2020-02-03 DIAGNOSIS — Z7729 Contact with and (suspected ) exposure to other hazardous substances: Secondary | ICD-10-CM

## 2020-02-03 NOTE — Patient Instructions (Signed)
We will check a quantiferon gold blood test today We will schedule you for a CT chest scan and breathing tests Follow up in 2 months
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
## 2020-02-05 LAB — QUANTIFERON-TB GOLD PLUS
Mitogen-NIL: >10 IU/mL
NIL: 0.03 IU/mL
QuantiFERON-TB Gold Plus: NEGATIVE
TB1-NIL: 0.01 IU/mL
TB2-NIL: 0 IU/mL

## 2020-02-05 LAB — COMPREHENSIVE METABOLIC PANEL
AG Ratio: 0.9 (calc) — ABNORMAL LOW (ref 1.0–2.5)
ALT: 17 U/L (ref 6–29)
AST: 25 U/L (ref 10–35)
Albumin: 3.6 g/dL (ref 3.6–5.1)
Alkaline phosphatase (APISO): 143 U/L (ref 37–153)
BUN: 13 mg/dL (ref 7–25)
CO2: 27 mmol/L (ref 20–32)
Calcium: 10.2 mg/dL (ref 8.6–10.4)
Chloride: 104 mmol/L (ref 98–110)
Creat: 0.69 mg/dL (ref 0.50–0.99)
Globulin: 3.8 g/dL (calc) — ABNORMAL HIGH (ref 1.9–3.7)
Glucose, Bld: 87 mg/dL (ref 65–99)
Potassium: 5.1 mmol/L (ref 3.5–5.3)
Sodium: 137 mmol/L (ref 135–146)
Total Bilirubin: 0.5 mg/dL (ref 0.2–1.2)
Total Protein: 7.4 g/dL (ref 6.1–8.1)

## 2020-02-05 LAB — HEPATITIS C RNA QUANTITATIVE
HCV RNA, PCR, QN (Log): <1.18 log IU/mL — ABNORMAL HIGH
HCV RNA, PCR, QN: <15 IU/mL — ABNORMAL HIGH

## 2020-02-16 ENCOUNTER — Other Ambulatory Visit: Payer: Self-pay

## 2020-02-16 ENCOUNTER — Encounter (HOSPITAL_COMMUNITY): Payer: Self-pay | Admitting: Emergency Medicine

## 2020-02-16 ENCOUNTER — Emergency Department (HOSPITAL_COMMUNITY)
Admission: EM | Admit: 2020-02-16 | Discharge: 2020-02-16 | Disposition: A | Discharge status: Home / Self Care | Payer: 59 | Attending: Emergency Medicine | Admitting: Emergency Medicine

## 2020-02-16 DIAGNOSIS — R202 Paresthesia of skin: Secondary | ICD-10-CM | POA: Diagnosis not present

## 2020-02-16 DIAGNOSIS — Z5321 Procedure and treatment not carried out due to patient leaving prior to being seen by health care provider: Secondary | ICD-10-CM | POA: Insufficient documentation

## 2020-02-16 DIAGNOSIS — M791 Myalgia, unspecified site: Secondary | ICD-10-CM | POA: Diagnosis present

## 2020-02-16 HISTORY — DX: Traumatic subdural hemorrhage with loss of consciousness status unknown, initial encounter: S06.5XAA

## 2020-02-16 HISTORY — DX: Traumatic subdural hemorrhage with loss of consciousness of unspecified duration, initial encounter: S06.5X9A

## 2020-02-16 LAB — CBC WITH DIFFERENTIAL/PLATELET
Abs Immature Granulocytes: 0.02 10*3/uL (ref 0.00–0.07)
Basophils Absolute: 0 10*3/uL (ref 0.0–0.1)
Basophils Relative: 0 %
Eosinophils Absolute: 0.2 10*3/uL (ref 0.0–0.5)
Eosinophils Relative: 2 %
HCT: 42.3 % (ref 36.0–46.0)
Hemoglobin: 14 g/dL (ref 12.0–15.0)
Immature Granulocytes: 0 %
Lymphocytes Relative: 33 %
Lymphs Abs: 3.3 10*3/uL (ref 0.7–4.0)
MCH: 31.2 pg (ref 26.0–34.0)
MCHC: 33.1 g/dL (ref 30.0–36.0)
MCV: 94.2 fL (ref 80.0–100.0)
Monocytes Absolute: 0.9 10*3/uL (ref 0.1–1.0)
Monocytes Relative: 9 %
Neutro Abs: 5.6 10*3/uL (ref 1.7–7.7)
Neutrophils Relative %: 56 %
Platelets: 170 10*3/uL (ref 150–400)
RBC: 4.49 MIL/uL (ref 3.87–5.11)
RDW: 11.7 % (ref 11.5–15.5)
WBC: 9.9 10*3/uL (ref 4.0–10.5)
nRBC: 0 % (ref 0.0–0.2)

## 2020-02-16 LAB — COMPREHENSIVE METABOLIC PANEL
ALT: 23 U/L (ref 0–44)
AST: 29 U/L (ref 15–41)
Albumin: 3.1 g/dL — ABNORMAL LOW (ref 3.5–5.0)
Alkaline Phosphatase: 141 U/L — ABNORMAL HIGH (ref 38–126)
Anion gap: 9 (ref 5–15)
BUN: 10 mg/dL (ref 8–23)
CO2: 24 mmol/L (ref 22–32)
Calcium: 9.6 mg/dL (ref 8.9–10.3)
Chloride: 103 mmol/L (ref 98–111)
Creatinine, Ser: 0.68 mg/dL (ref 0.44–1.00)
GFR, Estimated: >60 mL/min (ref >60)
Glucose, Bld: 156 mg/dL — ABNORMAL HIGH (ref 70–99)
Potassium: 3.5 mmol/L (ref 3.5–5.1)
Sodium: 136 mmol/L (ref 135–145)
Total Bilirubin: 0.6 mg/dL (ref 0.3–1.2)
Total Protein: 7.8 g/dL (ref 6.5–8.1)

## 2020-02-16 NOTE — ED Triage Notes (Addendum)
Patient reports numbness of entire head and left side body aches for 12 days , denies injury or fall , alert and oriented /respirations unlabored. No fever or cough . Hypertensive at triage .

## 2020-02-16 NOTE — ED Notes (Signed)
pts niece states that the pt would like to leave and to take them out of the waiting list.

## 2020-02-17 ENCOUNTER — Ambulatory Visit (INDEPENDENT_AMBULATORY_CARE_PROVIDER_SITE_OTHER)
Admission: RE | Admit: 2020-02-17 | Discharge: 2020-02-17 | Disposition: A | Discharge status: Home / Self Care | Payer: 59 | Source: Ambulatory Visit | Attending: Pulmonary Disease | Admitting: Pulmonary Disease

## 2020-02-17 DIAGNOSIS — R042 Hemoptysis: Secondary | ICD-10-CM | POA: Diagnosis not present

## 2020-02-18 ENCOUNTER — Other Ambulatory Visit: Payer: Self-pay | Admitting: Neurological Surgery

## 2020-02-18 ENCOUNTER — Other Ambulatory Visit: Payer: Self-pay

## 2020-02-18 ENCOUNTER — Other Ambulatory Visit (HOSPITAL_COMMUNITY): Payer: Self-pay | Admitting: Neurological Surgery

## 2020-02-18 ENCOUNTER — Ambulatory Visit (HOSPITAL_COMMUNITY)
Admission: RE | Admit: 2020-02-18 | Discharge: 2020-02-18 | Disposition: A | Discharge status: Home / Self Care | Payer: 59 | Source: Ambulatory Visit | Attending: Neurological Surgery | Admitting: Neurological Surgery

## 2020-02-18 DIAGNOSIS — S065X9A Traumatic subdural hemorrhage with loss of consciousness of unspecified duration, initial encounter: Secondary | ICD-10-CM | POA: Diagnosis present

## 2020-03-14 ENCOUNTER — Other Ambulatory Visit: Payer: Self-pay

## 2020-03-14 ENCOUNTER — Other Ambulatory Visit: Payer: 59

## 2020-03-14 DIAGNOSIS — R768 Other specified abnormal immunological findings in serum: Secondary | ICD-10-CM

## 2020-03-16 LAB — HEPATITIS C RNA QUANTITATIVE
HCV Quantitative Log: <1.18 log IU/mL
HCV RNA, PCR, QN: <15 IU/mL

## 2020-03-29 ENCOUNTER — Encounter: Payer: Self-pay | Admitting: Pulmonary Disease

## 2020-03-29 ENCOUNTER — Other Ambulatory Visit: Payer: Self-pay

## 2020-03-29 ENCOUNTER — Ambulatory Visit (INDEPENDENT_AMBULATORY_CARE_PROVIDER_SITE_OTHER): Payer: 59 | Admitting: Pulmonary Disease

## 2020-03-29 VITALS — BP 148/88 | HR 69 | Temp 98.4°F | Ht 61.0 in | Wt 154.0 lb

## 2020-03-29 DIAGNOSIS — R059 Cough, unspecified: Secondary | ICD-10-CM | POA: Diagnosis not present

## 2020-03-29 DIAGNOSIS — R042 Hemoptysis: Secondary | ICD-10-CM

## 2020-03-29 MED ORDER — ALBUTEROL SULFATE HFA 108 (90 BASE) MCG/ACT IN AERS: 2.0000 | INHALATION_SPRAY | Freq: Four times a day (QID) | RESPIRATORY_TRACT | 6 refills | Status: AC | PRN

## 2020-03-29 NOTE — Patient Instructions (Signed)
Start albuterol 1-2 puffs as needed for cough, chest tightness, wheezing or shortness of breath  We will refer you to Ear, Nose and Throat doctor for evaluation of cough  Please call if symptoms worsen and we can schedule you for follow up visit.

## 2020-03-29 NOTE — Progress Notes (Signed)
Synopsis: Referred by Tyson Dense, MD for hemoptysis  Subjective:   PATIENT ID: Alyssa Lyons GENDER: female DOB: Feb 09, 1956, MRN: [id number]   HPI  Chief Complaint  Patient presents with  . Follow-up    SOB and wheezing. Has blood in sputum when she coughs    Chan Alyssa Lyons is a 64 year old woman, never smoker who returns to pulmonary clinic for evaluation of hemoptysis.   Since last visit she continues to experience random coughing fits, productive to blood tinged sputum. She had CT Chest after the last visit with no significant airway abnormalities and no masses or nodules. Quantiferon gold was negative. She will have increased cough with cold air exposure. There is question whether she coughs more with eating or drinking as she does complain that food will sometimes get caught in her throat. She denies any sinus congestion or drainage. She does report she has noticed bleeding in her mouth before.    She was previously on Breo which did not help her cough. She does report that albuterol has helped with her cough.   Past Medical History:  Diagnosis Date  . Hepatitis   . Hepatitis C   . Hypertension   . Subdural hematoma (HCC)   . Thyroid disease      Family History  Problem Relation Age of Onset  . Hypertension Mother   . Heart disease Mother   . Hypertension Father   . Heart disease Father   . Hyperlipidemia Brother   . Hypertension Brother   . Hypertension Brother   . Hyperlipidemia Brother   . Hyperlipidemia Brother   . Hypertension Brother      Social History   Socioeconomic History  . Marital status: Married    Spouse name: Not on file  . Number of children: Not on file  . Years of education: Not on file  . Highest education level: Not on file  Occupational History  . Not on file  Tobacco Use  . Smoking status: Never Smoker  . Smokeless tobacco: Never Used  Vaping Use  . Vaping Use: Never used  Substance and Sexual Activity  . Alcohol use: No   . Drug use: No  . Sexual activity: Never  Other Topics Concern  . Not on file  Social History Narrative  . Not on file   Social Determinants of Health   Financial Resource Strain: Not on file  Food Insecurity: Not on file  Transportation Needs: Not on file  Physical Activity: Not on file  Stress: Not on file  Social Connections: Not on file  Intimate Partner Violence: Not on file     No Known Allergies   Outpatient Medications Prior to Visit  Medication Sig Dispense Refill  . gabapentin (NEURONTIN) 100 MG capsule Take 100 mg by mouth 2 (two) times daily.    Marland Kitchen HYDROcodone-acetaminophen (NORCO) 5-325 MG tablet Take 1 tablet by mouth every 6 (six) hours as needed for moderate pain. 30 tablet 0  . levothyroxine (SYNTHROID) 75 MCG tablet Take 1 tablet (75 mcg total) by mouth daily before breakfast. 90 tablet 1  . losartan (COZAAR) 50 MG tablet Take 1 tablet (50 mg total) by mouth daily. 90 tablet 3  . MAVYRET 100-40 MG TABS Take 3 tablets by mouth daily with breakfast. (Patient taking differently: Take 3 tablets by mouth at bedtime.) 84 tablet 1  . omeprazole (PRILOSEC) 20 MG capsule Take 20 mg by mouth daily.     No facility-administered medications prior to visit.  Review of Systems  Constitutional: Negative for chills, diaphoresis, fever, malaise/fatigue and weight loss.  HENT: Negative for congestion, sinus pain and sore throat.   Eyes: Negative for blurred vision.  Respiratory: Positive for cough, hemoptysis and shortness of breath. Negative for sputum production and wheezing.   Cardiovascular: Negative for chest pain, palpitations, orthopnea, claudication, leg swelling and PND.  Gastrointestinal: Negative for abdominal pain, constipation, diarrhea, heartburn, nausea and vomiting.  Genitourinary: Negative for dysuria, frequency, hematuria and urgency.  Musculoskeletal: Negative for back pain and myalgias.  Skin: Negative for itching and rash.  Neurological: Negative for  dizziness, focal weakness and headaches.  Psychiatric/Behavioral: Negative.    Objective:   Vitals:   03/29/20 1134  BP: (!) 148/88  Pulse: 69  Temp: 98.4 F (36.9 C)  TempSrc: Oral  SpO2: 100%  Weight: 154 lb (69.9 kg)  Height: 5\' 1"  (1.549 m)     Physical Exam Constitutional:      Appearance: She is normal weight.  HENT:     Head: Normocephalic and atraumatic.     Nose: Nose normal.     Mouth/Throat:     Mouth: Mucous membranes are moist.     Pharynx: Oropharynx is clear.  Eyes:     General: No scleral icterus.    Extraocular Movements: Extraocular movements intact.     Conjunctiva/sclera: Conjunctivae normal.     Pupils: Pupils are equal, round, and reactive to light.  Cardiovascular:     Rate and Rhythm: Normal rate and regular rhythm.     Pulses: Normal pulses.     Heart sounds: Normal heart sounds. No murmur heard.   Pulmonary:     Effort: Pulmonary effort is normal.     Breath sounds: Normal breath sounds. No wheezing, rhonchi or rales.  Abdominal:     General: Bowel sounds are normal.     Palpations: Abdomen is soft.  Musculoskeletal:     Cervical back: Neck supple.     Right lower leg: No edema.     Left lower leg: No edema.  Lymphadenopathy:     Cervical: No cervical adenopathy.  Skin:    Findings: No lesion or rash.  Neurological:     Mental Status: She is alert.     CBC    Component Value Date/Time   WBC 9.9 02/16/2020 2022   RBC 4.49 02/16/2020 2022   HGB 14.0 02/16/2020 2022   HGB 14.7 06/15/2019 1134   HCT 42.3 02/16/2020 2022   HCT 42.8 06/15/2019 1134   PLT 170 02/16/2020 2022   PLT 118 (L) 06/15/2019 1134   MCV 94.2 02/16/2020 2022   MCV 90 06/15/2019 1134   MCH 31.2 02/16/2020 2022   MCHC 33.1 02/16/2020 2022   RDW 11.7 02/16/2020 2022   RDW 11.3 (L) 06/15/2019 1134   LYMPHSABS 3.3 02/16/2020 2022   LYMPHSABS 4.8 (H) 05/11/2016 1901   MONOABS 0.9 02/16/2020 2022   EOSABS 0.2 02/16/2020 2022   EOSABS 0.3 05/11/2016 1901    BASOSABS 0.0 02/16/2020 2022   BASOSABS 0.0 05/11/2016 1901   BMP Latest Ref Rng & Units 02/16/2020 02/03/2020 01/19/2020  Glucose 70 - 99 mg/dL 03/20/2020) 87 -  BUN 8 - 23 mg/dL 10 13 -  Creatinine 081(K - 1.00 mg/dL 4.81 8.56 3.14  BUN/Creat Ratio 6 - 22 (calc) - NOT APPLICABLE -  Sodium 135 - 145 mmol/L 136 137 -  Potassium 3.5 - 5.1 mmol/L 3.5 5.1 -  Chloride 98 - 111 mmol/L 103 104 -  CO2 22 - 32 mmol/L 24 27 -  Calcium 8.9 - 10.3 mg/dL 9.6 77.8 -   Chest imaging: CT Chest WO contrast 02/17/20 Mediastinum/Nodes: No enlarged mediastinal or axillary lymph nodes. Thyroid gland, trachea, and esophagus demonstrate no significant findings.  Lungs/Pleura: Lungs are clear. No pleural effusion or pneumothorax.  CXR 01/17/20 Cardiomediastinal silhouette unchanged in size and contour with tortuosity of the thoracic aorta. Low lung volumes. Coarsened interstitial markings, similar to the prior. No pleural effusion. No pneumothorax. No confluent airspace disease.  No displaced fracture. Similar configuration of the thoracic vertebral bodies with degenerative changes.  PFT: None on file  Labs: Reviewed, as above  Echo: 05/19/2016 - Left ventricle: The cavity size was normal. Wall thickness was  normal. Systolic function was normal. The estimated ejection  fraction was in the range of 55% to 60%. Wall motion was normal;  there were no regional wall motion abnormalities. Left  ventricular diastolic function parameters were normal.  - Mitral valve: There was mild regurgitation.     Assessment & Plan:   Hemoptysis  Cough - Plan: albuterol (VENTOLIN HFA) 108 (90 Base) MCG/ACT inhaler, Ambulatory referral to ENT  Discussion: Tonjua Alyssa Lyons is a 64 year old woman, never smoker who returns to pulmonary clinic for hemoptysis.   The patients blood tinged sputum appears to be arising from her oral cavity or upper airway. CT chest does not indicate any airway abnormalities, masses  or nodules.   She continues to experience random coughing spells. She has history of benefit from albuterol inhaler therapy so we will prescribe her an albuterol inhaler to use as needed.   We will refer her to ENT for further evaluation of the cough and blood tinged sputum.   She may also benefit from barium swallow if she continues to complain of food getting stuck in there throat.   Follow up in 2 months  Melody Comas, MD [facility] Office: [phone]   Current Outpatient Medications:  .  gabapentin (NEURONTIN) 100 MG capsule, Take 100 mg by mouth 2 (two) times daily., Disp: , Rfl:  .  HYDROcodone-acetaminophen (NORCO) 5-325 MG tablet, Take 1 tablet by mouth every 6 (six) hours as needed for moderate pain., Disp: 30 tablet, Rfl: 0 .  levothyroxine (SYNTHROID) 75 MCG tablet, Take 1 tablet (75 mcg total) by mouth daily before breakfast., Disp: 90 tablet, Rfl: 1 .  losartan (COZAAR) 50 MG tablet, Take 1 tablet (50 mg total) by mouth daily., Disp: 90 tablet, Rfl: 3 .  MAVYRET 100-40 MG TABS, Take 3 tablets by mouth daily with breakfast. (Patient taking differently: Take 3 tablets by mouth at bedtime.), Disp: 84 tablet, Rfl: 1 .  omeprazole (PRILOSEC) 20 MG capsule, Take 20 mg by mouth daily., Disp: , Rfl:  .  albuterol (VENTOLIN HFA) 108 (90 Base) MCG/ACT inhaler, Inhale 2 puffs into the lungs every 6 (six) hours as needed for wheezing or shortness of breath., Disp: 8 g, Rfl: 6

## 2020-05-05 ENCOUNTER — Ambulatory Visit: Payer: 59 | Admitting: Neurology

## 2020-05-05 ENCOUNTER — Telehealth: Payer: Self-pay | Admitting: *Deleted

## 2020-05-05 NOTE — Telephone Encounter (Signed)
No showed new patient appointment. 

## 2020-06-13 ENCOUNTER — Ambulatory Visit: Payer: 59 | Admitting: Family

## 2020-12-23 ENCOUNTER — Other Ambulatory Visit: Payer: Self-pay

## 2020-12-23 ENCOUNTER — Emergency Department (HOSPITAL_COMMUNITY)
Admission: EM | Admit: 2020-12-23 | Discharge: 2020-12-24 | Disposition: A | Discharge status: Home / Self Care | Payer: 59 | Attending: Emergency Medicine | Admitting: Emergency Medicine

## 2020-12-23 DIAGNOSIS — I10 Essential (primary) hypertension: Secondary | ICD-10-CM | POA: Diagnosis not present

## 2020-12-23 DIAGNOSIS — M79604 Pain in right leg: Secondary | ICD-10-CM | POA: Diagnosis not present

## 2020-12-23 DIAGNOSIS — S32020A Wedge compression fracture of second lumbar vertebra, initial encounter for closed fracture: Secondary | ICD-10-CM | POA: Insufficient documentation

## 2020-12-23 DIAGNOSIS — M25559 Pain in unspecified hip: Secondary | ICD-10-CM | POA: Insufficient documentation

## 2020-12-23 DIAGNOSIS — W07XXXA Fall from chair, initial encounter: Secondary | ICD-10-CM | POA: Insufficient documentation

## 2020-12-23 DIAGNOSIS — E039 Hypothyroidism, unspecified: Secondary | ICD-10-CM | POA: Insufficient documentation

## 2020-12-23 DIAGNOSIS — S3992XA Unspecified injury of lower back, initial encounter: Secondary | ICD-10-CM | POA: Diagnosis present

## 2020-12-23 DIAGNOSIS — Z79899 Other long term (current) drug therapy: Secondary | ICD-10-CM | POA: Insufficient documentation

## 2020-12-23 NOTE — ED Triage Notes (Signed)
Pt unwitnessed fall off barstool, speaks Urdu [place], hx completed thanks to son. No thinners, -LOC, ?hit head. On amlodipine, gabapentin, losartan, levothyroxine. Pt endorses soreness in L hip area, ambulatory to triage, NAD noted  Mohammed [phone], son would like updates as able

## 2020-12-24 ENCOUNTER — Emergency Department (HOSPITAL_COMMUNITY): Payer: 59

## 2020-12-24 LAB — COMPREHENSIVE METABOLIC PANEL
ALT: 30 U/L (ref 0–44)
AST: 33 U/L (ref 15–41)
Albumin: 3.7 g/dL (ref 3.5–5.0)
Alkaline Phosphatase: 98 U/L (ref 38–126)
Anion gap: 8 (ref 5–15)
BUN: 8 mg/dL (ref 8–23)
CO2: 25 mmol/L (ref 22–32)
Calcium: 10.1 mg/dL (ref 8.9–10.3)
Chloride: 106 mmol/L (ref 98–111)
Creatinine, Ser: 0.71 mg/dL (ref 0.44–1.00)
GFR, Estimated: >60 mL/min (ref >60)
Glucose, Bld: 131 mg/dL — ABNORMAL HIGH (ref 70–99)
Potassium: 4.2 mmol/L (ref 3.5–5.1)
Sodium: 139 mmol/L (ref 135–145)
Total Bilirubin: 0.8 mg/dL (ref 0.3–1.2)
Total Protein: 7.9 g/dL (ref 6.5–8.1)

## 2020-12-24 LAB — CBC WITH DIFFERENTIAL/PLATELET
Abs Immature Granulocytes: 0.03 10*3/uL (ref 0.00–0.07)
Basophils Absolute: 0 10*3/uL (ref 0.0–0.1)
Basophils Relative: 0 %
Eosinophils Absolute: 0 10*3/uL (ref 0.0–0.5)
Eosinophils Relative: 0 %
HCT: 45.9 % (ref 36.0–46.0)
Hemoglobin: 15 g/dL (ref 12.0–15.0)
Immature Granulocytes: 0 %
Lymphocytes Relative: 26 %
Lymphs Abs: 2.8 10*3/uL (ref 0.7–4.0)
MCH: 29.7 pg (ref 26.0–34.0)
MCHC: 32.7 g/dL (ref 30.0–36.0)
MCV: 90.9 fL (ref 80.0–100.0)
Monocytes Absolute: 0.6 10*3/uL (ref 0.1–1.0)
Monocytes Relative: 5 %
Neutro Abs: 7.3 10*3/uL (ref 1.7–7.7)
Neutrophils Relative %: 69 %
Platelets: 173 10*3/uL (ref 150–400)
RBC: 5.05 MIL/uL (ref 3.87–5.11)
RDW: 12.1 % (ref 11.5–15.5)
WBC: 10.7 10*3/uL — ABNORMAL HIGH (ref 4.0–10.5)
nRBC: 0 % (ref 0.0–0.2)

## 2020-12-24 LAB — TROPONIN I (HIGH SENSITIVITY)
Troponin I (High Sensitivity): 4 ng/L (ref <18)
Troponin I (High Sensitivity): 5 ng/L (ref <18)

## 2020-12-24 MED ORDER — HYDROCODONE-ACETAMINOPHEN 5-325 MG PO TABS: 1.0000 | ORAL_TABLET | Freq: Four times a day (QID) | ORAL | 0 refills | Status: AC | PRN

## 2020-12-24 MED ORDER — OXYCODONE-ACETAMINOPHEN 5-325 MG PO TABS: 1.0000 | ORAL_TABLET | Freq: Once | ORAL | Status: DC

## 2020-12-24 MED ORDER — HYDROCODONE-ACETAMINOPHEN 5-325 MG PO TABS
1.0000 | ORAL_TABLET | Freq: Once | ORAL | Status: AC
  Administered 2020-12-24: 1 via ORAL
  Filled 2020-12-24: qty 1
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
## 2020-12-24 NOTE — ED Notes (Signed)
Patient transported to X-ray 
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
## 2020-12-24 NOTE — Discharge Instructions (Addendum)
Follow-up with neurosurgery in clinic for a reassessment in 2-4 weeks. I have sent pain medicine to your pharmacy on file. Utilize a walker for ambulation. We have provided a lumbar brace to assist with support.

## 2020-12-24 NOTE — ED Notes (Signed)
Pt slid out of recliner onto floor, with no visible injuries. Pt states she was trying to go to the bathroom. Fall witnessed by daughter

## 2020-12-24 NOTE — ED Provider Notes (Signed)
Emergency Medicine Provider Triage Evaluation Note  Alyssa Lyons , a 65 y.o. female  was evaluated in triage.  Patient presents with family after fall.  They report she lost her balance and fell from a stool onto her back earlier today.  She did hit her head but had no loss of consciousness.  She has been complaining of neck back and hip pain since that time.  Severe in nature.  3:14 AM While here in the emergency department she attempted to stand from her chair to go to the bathroom and was unable to bear weight on her legs due to severe pain.  She fell but did not hit her head the second time.  Patient reports since arriving in the emergency department she has developed central chest pain.  No nausea or diaphoresis.  No shortness of breath.  No previous episodes of chest pain.  Review of Systems  Positive: Neck pain, back pain, hip pain Negative: Syncope  Physical Exam  BP (!) 163/88 (BP Location: Left Arm)   Pulse 87   Temp 97.6 F (36.4 C)   Resp 18   SpO2 96%  Gen:   Awake, no distress   Resp:  Normal effort  MSK:   Moves extremities without difficulty,  Other:  Sensation intact to the bilateral lower extremities.  Strength 5/5 with dorsiflexion and plantarflexion.  Movement of the bilateral hips creates pain in her hips and back.  Unable to stand without assistance.  Medical Decision Making  Medically screening exam initiated at 3:01 AM.  Appropriate orders placed.  Lamica Mefferd was informed that the remainder of the evaluation will be completed by another provider, this initial triage assessment does not replace that evaluation, and the importance of remaining in the ED until their evaluation is complete.  Patient with fall. Concern for traumatic compression fracture.  Imaging pending.  Patient also complaining of chest pain.  Chest pain work-up pending.   Fount Bahe, Boyd Kerbs 12/24/20 0320    Gilda Crease, MD 12/24/20 [phone]

## 2020-12-24 NOTE — Progress Notes (Signed)
Orthopedic Tech Progress Note Patient Details:  Alyssa Lyons 1956-03-19 [id number]  Ortho Devices Type of Ortho Device: Lumbar corsett Ortho Device/Splint Location: Back Ortho Device/Splint Interventions: Application   Post Interventions Patient Tolerated: Well  [name] Alyssa Lyons 12/24/2020, 11:52 AM
